# Patient Record
Sex: Female | Born: 1935
Health system: Southern US, Community
[De-identification: ages and names within clinical notes are randomized; demographics above are authoritative.]

## PROBLEM LIST (undated history)

## (undated) DIAGNOSIS — E7800 Pure hypercholesterolemia, unspecified: Secondary | ICD-10-CM

## (undated) DIAGNOSIS — H9190 Unspecified hearing loss, unspecified ear: Secondary | ICD-10-CM

## (undated) DIAGNOSIS — E041 Nontoxic single thyroid nodule: Secondary | ICD-10-CM

## (undated) DIAGNOSIS — M1990 Unspecified osteoarthritis, unspecified site: Secondary | ICD-10-CM

## (undated) DIAGNOSIS — K219 Gastro-esophageal reflux disease without esophagitis: Secondary | ICD-10-CM

## (undated) DIAGNOSIS — F32A Depression, unspecified: Secondary | ICD-10-CM

## (undated) DIAGNOSIS — R413 Other amnesia: Secondary | ICD-10-CM

## (undated) DIAGNOSIS — F329 Major depressive disorder, single episode, unspecified: Secondary | ICD-10-CM

## (undated) DIAGNOSIS — M25569 Pain in unspecified knee: Secondary | ICD-10-CM

## (undated) HISTORY — DX: Depression, unspecified: F32.A

## (undated) HISTORY — DX: Unspecified hearing loss, unspecified ear: H91.90

## (undated) HISTORY — PX: KNEE SURGERY: SHX244

## (undated) HISTORY — DX: Pure hypercholesterolemia, unspecified: E78.00

## (undated) HISTORY — DX: Other amnesia: R41.3

## (undated) HISTORY — DX: Gastro-esophageal reflux disease without esophagitis: K21.9

## (undated) HISTORY — DX: Pain in unspecified knee: M25.569

## (undated) HISTORY — PX: THYROID SURGERY: SHX805

## (undated) HISTORY — DX: Nontoxic single thyroid nodule: E04.1

## (undated) HISTORY — DX: Unspecified osteoarthritis, unspecified site: M19.90

---
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
## 1998-08-19 ENCOUNTER — Ambulatory Visit (HOSPITAL_BASED_OUTPATIENT_CLINIC_OR_DEPARTMENT_OTHER): Admission: RE | Admit: 1998-08-19 | Discharge: 1998-08-19 | Payer: Self-pay | Admitting: Orthopedic Surgery

## 2006-04-12 ENCOUNTER — Ambulatory Visit (HOSPITAL_COMMUNITY): Admission: RE | Admit: 2006-04-12 | Discharge: 2006-04-12 | Payer: Self-pay | Admitting: Gastroenterology

## 2006-04-12 ENCOUNTER — Ambulatory Visit: Payer: Self-pay | Admitting: Gastroenterology

## 2010-05-27 ENCOUNTER — Ambulatory Visit: Payer: Self-pay | Admitting: Internal Medicine

## 2010-06-16 ENCOUNTER — Ambulatory Visit (HOSPITAL_COMMUNITY)
Admission: RE | Admit: 2010-06-16 | Discharge: 2010-06-16 | Payer: Self-pay | Source: Home / Self Care | Attending: Internal Medicine | Admitting: Internal Medicine

## 2010-06-16 ENCOUNTER — Ambulatory Visit: Admit: 2010-06-16 | Payer: Self-pay | Admitting: Internal Medicine

## 2010-06-18 LAB — H. PYLORI ANTIBODY, IGG: H Pylori IgG: <0.4 {ISR}

## 2010-07-04 NOTE — Op Note (Signed)
Danielle Barnes, Danielle Barnes               ACCOUNT NO.:  [id number]  MEDICAL RECORD NO.:  [id number]          PATIENT TYPE:  AMB  LOCATION:  DAY                           FACILITY:  [facility]  PHYSICIAN:  Lionel December, M.D.    DATE OF BIRTH:  [birth year]  DATE OF PROCEDURE:  06/16/2010 DATE OF DISCHARGE:                              OPERATIVE REPORT   PROCEDURE:  Esophagogastroduodenoscopy with esophageal dilation followed by a colonoscopy.  INDICATION:  Danielle Barnes is a 77 year old Caucasian female with chronic GERD maintained on a PPI who complains of solid food dysphagia.  She also has iron-deficiency anemia.  Her stool, however, is guaiac negative.  She takes Naprosyn up to 1100 mg per day in addition to low-dose ASA. Family history is positive for colon carcinoma in 2 sisters, one was diagnosed at age 40 and died at 59, another one was diagnosed at 26 and diet 2 years later.  Her last colonoscopy was over 5 years ago.  Procedure risks were reviewed with the patient and informed consent was obtained.  MEDICATIONS FOR CONSCIOUS SEDATION: 1. Cetacaine spray for oropharyngeal topical anesthesia. 2. Demerol 50 mg IV. 3. Versed 7 mg IV in divided dose.  FINDINGS:  Procedure performed in endoscopy suite.  The patient's vital signs and O2 sats were monitored during the procedure and remained stable.  PROCEDURES: 1. Esophagogastroduodenoscopy.  The patient was placed in left lateral     recumbent position and Pentax videoscope was passed through     oropharynx without any difficulty into esophagus.   Esophagus:     Mucosa of the esophagus normal.  GE junction was located     at 38-cm from the incisors and hiatus was at 36.  No ring or     stricture was noted.   Stomach:     It was empty and distended very well with insufflation.     Folds of the proximal stomach are normal.  Mucosa at body was     normal in the antrum.  There were 2 small telangiectasia without     stigmata of bleeding.   Towards the anterior wall and antrum was a     semilunar shaped area with erythema and erosion in the scar.  This     was felt to be healing ulcer.  It was about 4-5 mm in maximal     diameter and at least 12-mm long.  Pyloric channel was patent.     Angularis, fundus and cardia were examined by retroflexing scope     were normal.   Duodenum:     Bulbar mucosa was normal.  Scope was passed into     second part of the duodenal mucosa and folds were normal.     Endoscope was withdrawn.   Esophageal dilation was performed by passing 54-French Maloney     dilator to full insertion.  As the dilators were withdrawn,     endoscope was passed again and esophageal mucosa reexamined and no     disruption noted.  Endoscope was withdrawn and the patient prepared     for procedure #2. 2. Colonoscopy:  Rectal examination performed.  No abnormality noted     on external or digital exam.  Pentax videoscope was placed through     rectum and advanced under vision into sigmoid colon beyond.  She     had diffuse pigmentation consistent with melanosis coli.  She had     very redundant colon.  Scattered diverticula noted at sigmoid and     one at hepatic flexure.  Scope was passed into cecum which was     identified by appendiceal orifice and ileocecal valve.  Pictures     were taken for the record.  To the right of appendiceal orifice was     a area with pale slightly raised mucosa.  This was ablated via cold     biopsy.  As the scope was withdrawn, rest of the colonic mucosa was     carefully examined and no other abnormalities were noted.  Rectal     mucosa was normal.  Scope was retroflexed to examine anorectal     junction which was unremarkable.  Endoscope was straightened and     withdrawn  Withdrawal time was over 7 minutes.  The patient     tolerated the procedure well.  FINAL DIAGNOSES: 1. Small sliding hiatal hernia without ring or stricture formation. 2. Esophagus dilated by passing  54-French Maloney dilator given     history of dysphagia. 3. Two small telangiectasia at gastric antrum without stigmata of     bleeding. 4. Healing gastric ulcer at antrum, possibly secondary to NSAID     therapy. 5. Colonoscopy completed to cecum. 6. Melanosis coli with few diverticula at the sigmoid colon, one     hepatic flexure. 7. Pale slightly raised mucosa at the cecum which was ablated via cold     biopsy.  This could be hyperplastic polyp.  RECOMMENDATIONS: 1. She will continue high-fiber diet and Prilosec as before. 2. The patient advised to discontinue Naprosyn and take ibuprofen OTC     2-400 mg with food twice daily and no more. 3. We will check her H. pylori serology today. 4. I will contact the patient with results of biopsy and further     recommendations.     Lionel December, M.D.     NR/MEDQ  D:  06/16/2010  T:  06/16/2010  Job:  [id number]  cc:   Dr. Sherryll Burger  Electronically Signed by Lionel December M.D. on 07/04/2010 12:52:52 PM

## 2010-10-17 NOTE — Op Note (Signed)
NAMEMEHAK, ROSKELLEY               ACCOUNT NO.:  [id number]   MEDICAL RECORD NO.:  [id number]          PATIENT TYPE:  AMB   LOCATION:  DAY                           FACILITY:  [facility]   PHYSICIAN:  Kassie Mends, M.D.      DATE OF BIRTH:  1932/03/17   DATE OF PROCEDURE:  04/12/2006  DATE OF DISCHARGE:                                 OPERATIVE REPORT   REFERRING PHYSICIAN:  Dr. Weyman Pedro.   PROCEDURE:  Colonoscopy.   INDICATION FOR EXAMINATION:  Ms. Kroenke is a 77 year old female whose sister  had colon cancer at 16.   FINDINGS:  1. Normal colon without evidence of polyps, masses, diverticula,      inflammatory changes or vascular ectasias.  2. Normal retroflexed view of the rectum.   RECOMMENDATIONS:  1. Screening colonoscopy in 5 years.  2. Follow up with Dr. Eliberto Ivory.   MEDICATIONS:  1. Demerol 50 mg IV.  2. Versed 5 mg IV.   PROCEDURE TECHNIQUE:  Physical exam was performed, and informed consent was  obtained from the patient after explaining the benefits, risks and  alternatives to the procedure.  The patient was connected to monitor and  placed in the left lateral position.  Continuous oxygen was provided by  nasal, and IV medicine administered through an indwelling cannula.  After  administration of sedation and rectal exam, the patient's rectum was  intubated, and the  scope was advanced under direct visualization to the cecum.  The scope was  subsequently removed slowly by carefully examining the color, texture,  anatomy and integrity of the mucosa on the way out.  The patient was  recovered in the endoscopy suite and discharged home in satisfactory  condition.      Kassie Mends, M.D.  Electronically Signed     SM/MEDQ  D:  04/12/2006  T:  04/12/2006  Job:  [id number]   cc:   Weyman Pedro  [address]  [place]  [place] [address]

## 2010-12-10 ENCOUNTER — Other Ambulatory Visit (HOSPITAL_COMMUNITY): Payer: Self-pay | Admitting: Orthopedic Surgery

## 2010-12-10 ENCOUNTER — Encounter (HOSPITAL_COMMUNITY)
Admission: RE | Admit: 2010-12-10 | Discharge: 2010-12-10 | Disposition: A | Discharge status: Home / Self Care | Payer: Medicare Other | Source: Ambulatory Visit | Attending: Orthopedic Surgery | Admitting: Orthopedic Surgery

## 2010-12-10 ENCOUNTER — Ambulatory Visit (HOSPITAL_COMMUNITY)
Admission: RE | Admit: 2010-12-10 | Discharge: 2010-12-10 | Disposition: A | Discharge status: Home / Self Care | Payer: Medicare Other | Source: Ambulatory Visit | Attending: Orthopedic Surgery | Admitting: Orthopedic Surgery

## 2010-12-10 DIAGNOSIS — M4120 Other idiopathic scoliosis, site unspecified: Secondary | ICD-10-CM | POA: Insufficient documentation

## 2010-12-10 DIAGNOSIS — Z01812 Encounter for preprocedural laboratory examination: Secondary | ICD-10-CM | POA: Insufficient documentation

## 2010-12-10 DIAGNOSIS — M899 Disorder of bone, unspecified: Secondary | ICD-10-CM | POA: Insufficient documentation

## 2010-12-10 DIAGNOSIS — M479 Spondylosis, unspecified: Secondary | ICD-10-CM | POA: Insufficient documentation

## 2010-12-10 DIAGNOSIS — Z01811 Encounter for preprocedural respiratory examination: Secondary | ICD-10-CM

## 2010-12-10 DIAGNOSIS — M949 Disorder of cartilage, unspecified: Secondary | ICD-10-CM | POA: Insufficient documentation

## 2010-12-10 DIAGNOSIS — Z01818 Encounter for other preprocedural examination: Secondary | ICD-10-CM | POA: Insufficient documentation

## 2010-12-10 DIAGNOSIS — IMO0002 Reserved for concepts with insufficient information to code with codable children: Secondary | ICD-10-CM | POA: Insufficient documentation

## 2010-12-10 LAB — URINALYSIS, ROUTINE W REFLEX MICROSCOPIC
Bilirubin Urine: NEGATIVE
Glucose, UA: NEGATIVE mg/dL
Hgb urine dipstick: NEGATIVE
Ketones, ur: NEGATIVE mg/dL
Leukocytes, UA: NEGATIVE
Nitrite: NEGATIVE
Protein, ur: NEGATIVE mg/dL
Specific Gravity, Urine: 1.024 (ref 1.005–1.030)
Urobilinogen, UA: 0.2 mg/dL (ref 0.0–1.0)
pH: 5.5 (ref 5.0–8.0)

## 2010-12-10 LAB — CBC
HCT: 44.8 % (ref 36.0–46.0)
Hemoglobin: 15.1 g/dL — ABNORMAL HIGH (ref 12.0–15.0)
MCH: 31.5 pg (ref 26.0–34.0)
MCHC: 33.7 g/dL (ref 30.0–36.0)
MCV: 93.5 fL (ref 78.0–100.0)
Platelets: 233 10*3/uL (ref 150–400)
RBC: 4.79 MIL/uL (ref 3.87–5.11)
RDW: 13.7 % (ref 11.5–15.5)
WBC: 6.3 10*3/uL (ref 4.0–10.5)

## 2010-12-10 LAB — DIFFERENTIAL
Basophils Absolute: 0 10*3/uL (ref 0.0–0.1)
Basophils Relative: 0 % (ref 0–1)
Eosinophils Absolute: 0.2 10*3/uL (ref 0.0–0.7)
Eosinophils Relative: 3 % (ref 0–5)
Lymphocytes Relative: 26 % (ref 12–46)
Lymphs Abs: 1.6 10*3/uL (ref 0.7–4.0)
Monocytes Absolute: 0.7 10*3/uL (ref 0.1–1.0)
Monocytes Relative: 12 % (ref 3–12)
Neutro Abs: 3.8 10*3/uL (ref 1.7–7.7)
Neutrophils Relative %: 60 % (ref 43–77)

## 2010-12-10 LAB — COMPREHENSIVE METABOLIC PANEL
ALT: 16 U/L (ref 0–35)
AST: 21 U/L (ref 0–37)
Albumin: 3.7 g/dL (ref 3.5–5.2)
Alkaline Phosphatase: 91 U/L (ref 39–117)
BUN: 18 mg/dL (ref 6–23)
CO2: 32 mEq/L (ref 19–32)
Calcium: 9.8 mg/dL (ref 8.4–10.5)
Chloride: 99 mEq/L (ref 96–112)
Creatinine, Ser: 1.12 mg/dL — ABNORMAL HIGH (ref 0.50–1.10)
GFR calc Af Amer: 57 mL/min — ABNORMAL LOW (ref >60)
GFR calc non Af Amer: 47 mL/min — ABNORMAL LOW (ref >60)
Glucose, Bld: 109 mg/dL — ABNORMAL HIGH (ref 70–99)
Potassium: 4.1 mEq/L (ref 3.5–5.1)
Sodium: 137 mEq/L (ref 135–145)
Total Bilirubin: 0.3 mg/dL (ref 0.3–1.2)
Total Protein: 6.8 g/dL (ref 6.0–8.3)

## 2010-12-10 LAB — APTT: aPTT: 28 seconds (ref 24–37)

## 2010-12-10 LAB — TYPE AND SCREEN
ABO/RH(D): O POS
Antibody Screen: NEGATIVE

## 2010-12-10 LAB — SURGICAL PCR SCREEN
MRSA, PCR: NEGATIVE
Staphylococcus aureus: POSITIVE — AB

## 2010-12-10 LAB — ABO/RH: ABO/RH(D): O POS

## 2010-12-10 LAB — PROTIME-INR
INR: 0.93 (ref 0.00–1.49)
Prothrombin Time: 12.7 seconds (ref 11.6–15.2)

## 2010-12-11 ENCOUNTER — Encounter: Payer: Self-pay | Admitting: Cardiology

## 2010-12-11 LAB — URINE CULTURE
Colony Count: 50000
Culture  Setup Time: 201207111231

## 2010-12-12 ENCOUNTER — Encounter: Payer: Self-pay | Admitting: Cardiology

## 2010-12-12 ENCOUNTER — Ambulatory Visit (INDEPENDENT_AMBULATORY_CARE_PROVIDER_SITE_OTHER): Payer: Medicare Other | Admitting: Cardiology

## 2010-12-12 DIAGNOSIS — Z01818 Encounter for other preprocedural examination: Secondary | ICD-10-CM

## 2010-12-12 DIAGNOSIS — I491 Atrial premature depolarization: Secondary | ICD-10-CM

## 2010-12-12 NOTE — Progress Notes (Signed)
   Danielle Barnes Date of Birth: Apr 29, 1932   History of Present Illness: Danielle Barnes is seen at the request of Dr. Sherlean Foot for preoperative evaluation for knee surgery. She is a pleasant 77 year old white female who has been in excellent health. She was scheduled for knee surgery but her ECG was abnormal and she was referred for further evaluation. She states she rarely notices mild palpitations. Had pain tachycardia. She denies any chest pain of breath, dizziness, or syncope. She has no history of myocardial infarction, angina, or congestive heart failure.  Current Outpatient Prescriptions on File Prior to Visit  Medication Sig Dispense Refill  . aspirin 81 MG tablet Take 81 mg by mouth daily.        Marland Kitchen CALCIUM PO Take by mouth.        . fish oil-omega-3 fatty acids 1000 MG capsule Take 1 g by mouth daily.        Marland Kitchen HYDROcodone-acetaminophen (LORTAB) 7.5-500 MG per tablet Take 1 tablet by mouth every 6 (six) hours as needed.        Marland Kitchen levothyroxine (SYNTHROID, LEVOTHROID) 125 MCG tablet Take 125 mcg by mouth daily.        . Multiple Vitamin (MULTIVITAMIN) tablet Take 1 tablet by mouth daily.        Marland Kitchen omeprazole (PRILOSEC) 20 MG capsule Take 20 mg by mouth daily.        . Red Yeast Rice Extract (RED YEAST RICE PO) Take by mouth daily.        . traZODone (DESYREL) 100 MG tablet Take 100 mg by mouth at bedtime.          No Known Allergies  Past Medical History  Diagnosis Date  . Knee pain   . Arthritis   . Hypercholesterolemia   . GERD (gastroesophageal reflux disease)     Past Surgical History  Procedure Date  . Thyroid surgery     lump    History  Smoking status  . Never Smoker   Smokeless tobacco  . Never Used    History  Alcohol Use No    Family History  Problem Relation Age of Onset  . Stroke Mother   . Diabetes Sister   . Cancer Sister   . Heart disease Brother   . Heart attack Brother     Review of Systems: The review of systems is positive for chronic knee  pain..  All other systems were reviewed and are negative.  Physical Exam: BP 136/78  Pulse 78  Ht 5' 3.5" (1.613 m)  Wt 139 lb (63.05 kg)  BMI 24.24 kg/m2 She is a pleasant white female in no acute distress. She is normocephalic, atraumatic. Pupils are round and reactive to light accommodation. Extraocular movements are full. Oropharynx is clear. Neck is supple no JVD, adenopathy, thyromegaly, or bruits. Lungs are clear. Cardiac exam reveals a regular rate and rhythm with occasional extrasystoles. She has normal S1-S2. No gallops, murmur, or click. Abdomen is soft and nontender without masses or bruits. Femoral and pedal pulses are 2+ and symmetric. She has no edema. Skin is warm and dry. She is alert and oriented x3. Cranial nerves II through XII are intact. LABORATORY DATA: ECG was reviewed. This demonstrates normal sinus rhythm with frequent PACs. There is poor R-wave progression V1 through V3. This is unchanged from prior ECG in May of this year. Recent chemistry panel and CBC were normal.  Assessment / Plan:

## 2010-12-12 NOTE — Assessment & Plan Note (Signed)
Patient has frequent PACs on exam. Her cardiac exam is otherwise normal. She is asymptomatic. PACs are benign and do not warrant further evaluation. She is cleared to proceed with her knee surgery.

## 2010-12-12 NOTE — Patient Instructions (Signed)
We will clear you for your knee surgery.  We will send a copy of our assessment to Dr. Sherryll Burger.  Good luck!

## 2010-12-15 ENCOUNTER — Inpatient Hospital Stay (HOSPITAL_COMMUNITY)
Admission: RE | Admit: 2010-12-15 | Discharge: 2010-12-17 | DRG: 470 | Disposition: A | Discharge status: Home Health | Payer: Medicare Other | Source: Ambulatory Visit | Attending: Orthopedic Surgery | Admitting: Orthopedic Surgery

## 2010-12-15 DIAGNOSIS — E039 Hypothyroidism, unspecified: Secondary | ICD-10-CM | POA: Diagnosis present

## 2010-12-15 DIAGNOSIS — M1710 Unilateral primary osteoarthritis, unspecified knee: Principal | ICD-10-CM | POA: Diagnosis present

## 2010-12-15 DIAGNOSIS — K219 Gastro-esophageal reflux disease without esophagitis: Secondary | ICD-10-CM | POA: Diagnosis present

## 2010-12-15 DIAGNOSIS — D62 Acute posthemorrhagic anemia: Secondary | ICD-10-CM | POA: Diagnosis not present

## 2010-12-15 DIAGNOSIS — IMO0002 Reserved for concepts with insufficient information to code with codable children: Principal | ICD-10-CM | POA: Diagnosis present

## 2010-12-16 LAB — CBC
HCT: 33.8 % — ABNORMAL LOW (ref 36.0–46.0)
Hemoglobin: 11.4 g/dL — ABNORMAL LOW (ref 12.0–15.0)
MCH: 31.6 pg (ref 26.0–34.0)
MCHC: 33.7 g/dL (ref 30.0–36.0)
MCV: 93.6 fL (ref 78.0–100.0)
Platelets: 191 10*3/uL (ref 150–400)
RBC: 3.61 MIL/uL — ABNORMAL LOW (ref 3.87–5.11)
RDW: 13.7 % (ref 11.5–15.5)
WBC: 8.6 10*3/uL (ref 4.0–10.5)

## 2010-12-16 LAB — BASIC METABOLIC PANEL
BUN: 11 mg/dL (ref 6–23)
CO2: 31 mEq/L (ref 19–32)
Calcium: 8.9 mg/dL (ref 8.4–10.5)
Chloride: 106 mEq/L (ref 96–112)
Creatinine, Ser: 0.87 mg/dL (ref 0.50–1.10)
GFR calc Af Amer: >60 mL/min (ref >60)
GFR calc non Af Amer: >60 mL/min (ref >60)
Glucose, Bld: 105 mg/dL — ABNORMAL HIGH (ref 70–99)
Potassium: 4.6 mEq/L (ref 3.5–5.1)
Sodium: 142 mEq/L (ref 135–145)

## 2010-12-16 LAB — GLUCOSE, CAPILLARY: Glucose-Capillary: 98 mg/dL (ref 70–99)

## 2010-12-17 LAB — CBC
HCT: 29.5 % — ABNORMAL LOW (ref 36.0–46.0)
Hemoglobin: 9.9 g/dL — ABNORMAL LOW (ref 12.0–15.0)
MCH: 31.6 pg (ref 26.0–34.0)
MCHC: 33.6 g/dL (ref 30.0–36.0)
MCV: 94.2 fL (ref 78.0–100.0)
Platelets: 160 10*3/uL (ref 150–400)
RBC: 3.13 MIL/uL — ABNORMAL LOW (ref 3.87–5.11)
RDW: 14.1 % (ref 11.5–15.5)
WBC: 5.6 10*3/uL (ref 4.0–10.5)

## 2010-12-17 LAB — BASIC METABOLIC PANEL
BUN: 9 mg/dL (ref 6–23)
CO2: 30 mEq/L (ref 19–32)
Calcium: 8.5 mg/dL (ref 8.4–10.5)
Chloride: 105 mEq/L (ref 96–112)
Creatinine, Ser: 0.62 mg/dL (ref 0.50–1.10)
GFR calc Af Amer: >60 mL/min (ref >60)
GFR calc non Af Amer: >60 mL/min (ref >60)
Glucose, Bld: 113 mg/dL — ABNORMAL HIGH (ref 70–99)
Potassium: 3.7 mEq/L (ref 3.5–5.1)
Sodium: 140 mEq/L (ref 135–145)

## 2010-12-31 NOTE — Op Note (Signed)
NAMEBROOKELYNNE, DIMPERIO NO.:  [id number]  MEDICAL RECORD NO.:  [id number]  LOCATION:  5035                         FACILITY:  [facility]  PHYSICIAN:  Mila Homer. Sherlean Foot, M.D. DATE OF BIRTH:  1931-11-10  DATE OF PROCEDURE:  12/15/2010 DATE OF DISCHARGE:                              OPERATIVE REPORT   SURGEON:  Mila Homer. Sherlean Foot, MD  ASSISTANTS: 1. Altamese Cabal, PA-C 2. __________, PA-C  ANESTHESIA:  General.  PREOPERATIVE DIAGNOSIS:  Right knee osteoarthritis.  POSTOPERATIVE DIAGNOSIS:  Right knee osteoarthritis.  PROCEDURE:  Right total knee arthroplasty.  INDICATIONS FOR PROCEDURE:  The patient is a 77 year old white female with failure of conservative measures for osteoarthritis of the right knee.  Informed consent was obtained.  DESCRIPTION OF PROCEDURE:  The patient was laid supine, administered general anesthesia, right leg was prepped and draped in usual sterile fashion.  The extremity was exsanguinated with the Esmarch and tourniquet inflated to 350 mmHg.  A midline incision was made with a #10 blade.  New blade was used to make a median parapatellar arthrotomy and performed a synovectomy.  Deep MCL was elevated off the medial crest of the tibia around to and through the semimembranosus tendon.  Then, I everted the patella, measured 23 mm thick and had incredible number of osteophytes.  Large osteophytes were removed and the patella was reamed down 8.5 mm and through lug holes, were drilled through the 32-mm template.  I had recreated a native thickness with 32 template in place. I then removed the trial 1 and 2 flexion, subluxing the patella laterally.  I then used the extramedullary alignment system on the tibia to make perpendicular cut to the anatomic axis of the tibia in  30 degrees posterior slope.  I then used the intramedullary system on the field to make a 60-degree valgus cut.  The epicondylar axis measured 30 degrees.  I sized to size 8 and  pinned to 30-degree external rotation holes.  I then had an anterior-posterior chamfer cuts with sagittal saw. At this point, a 10-mm spacer block in the knee.  I had good flexion and extension gap balance.  I then finished the femur with size 8 finishing block, finished the tibia with a size 4 tibial tray, drilling keel.  I then trialed with an E femur, 4 tibia, 10 insert, 32 patella.  I had good flexion and extension gap balance __________ degrees.  I then removed the trial components and copiously irrigated.  I then cemented the components removing all excess cement allowing the cement to harden in extension.  I left a Hemovac coming out superolaterally and deep to the arthrotomy pain catheter coming out supermedial and superficial to the arthrotomy.  I then let the tourniquet down, obtaining hemostasis. I then lavaged again and closed the arthrotomy with figure-of-eight #1 Vicryl sutures, buried 0 Vicryl sutures, subcuticular 2-0 Vicryl stitches, skin staples.  Dressed with Xeroform dressing sponges, sterile Webril, __________ stocking.  COMPLICATIONS:  None.  DRAINS:  One Hemovac and one pain  catheter.  ESTIMATED BLOOD LOSS:  300 mL.          ______________________________ Mila Homer. Sherlean Foot, M.D.     SDL/MEDQ  D:  12/15/2010  T:  12/15/2010  Job:  [id number]  Electronically Signed by Georgena Spurling M.D. on 12/31/2010 08:09:40 AM

## 2011-08-14 IMAGING — CR DG CHEST 2V
2 series · 2 of 2 positions shown · non-contrast
Comparison: None.

CLINICAL DATA: Preoperative cardiopulmonary evaluation.

CHEST - 2 VIEW

[view not recorded (1 of 2)]
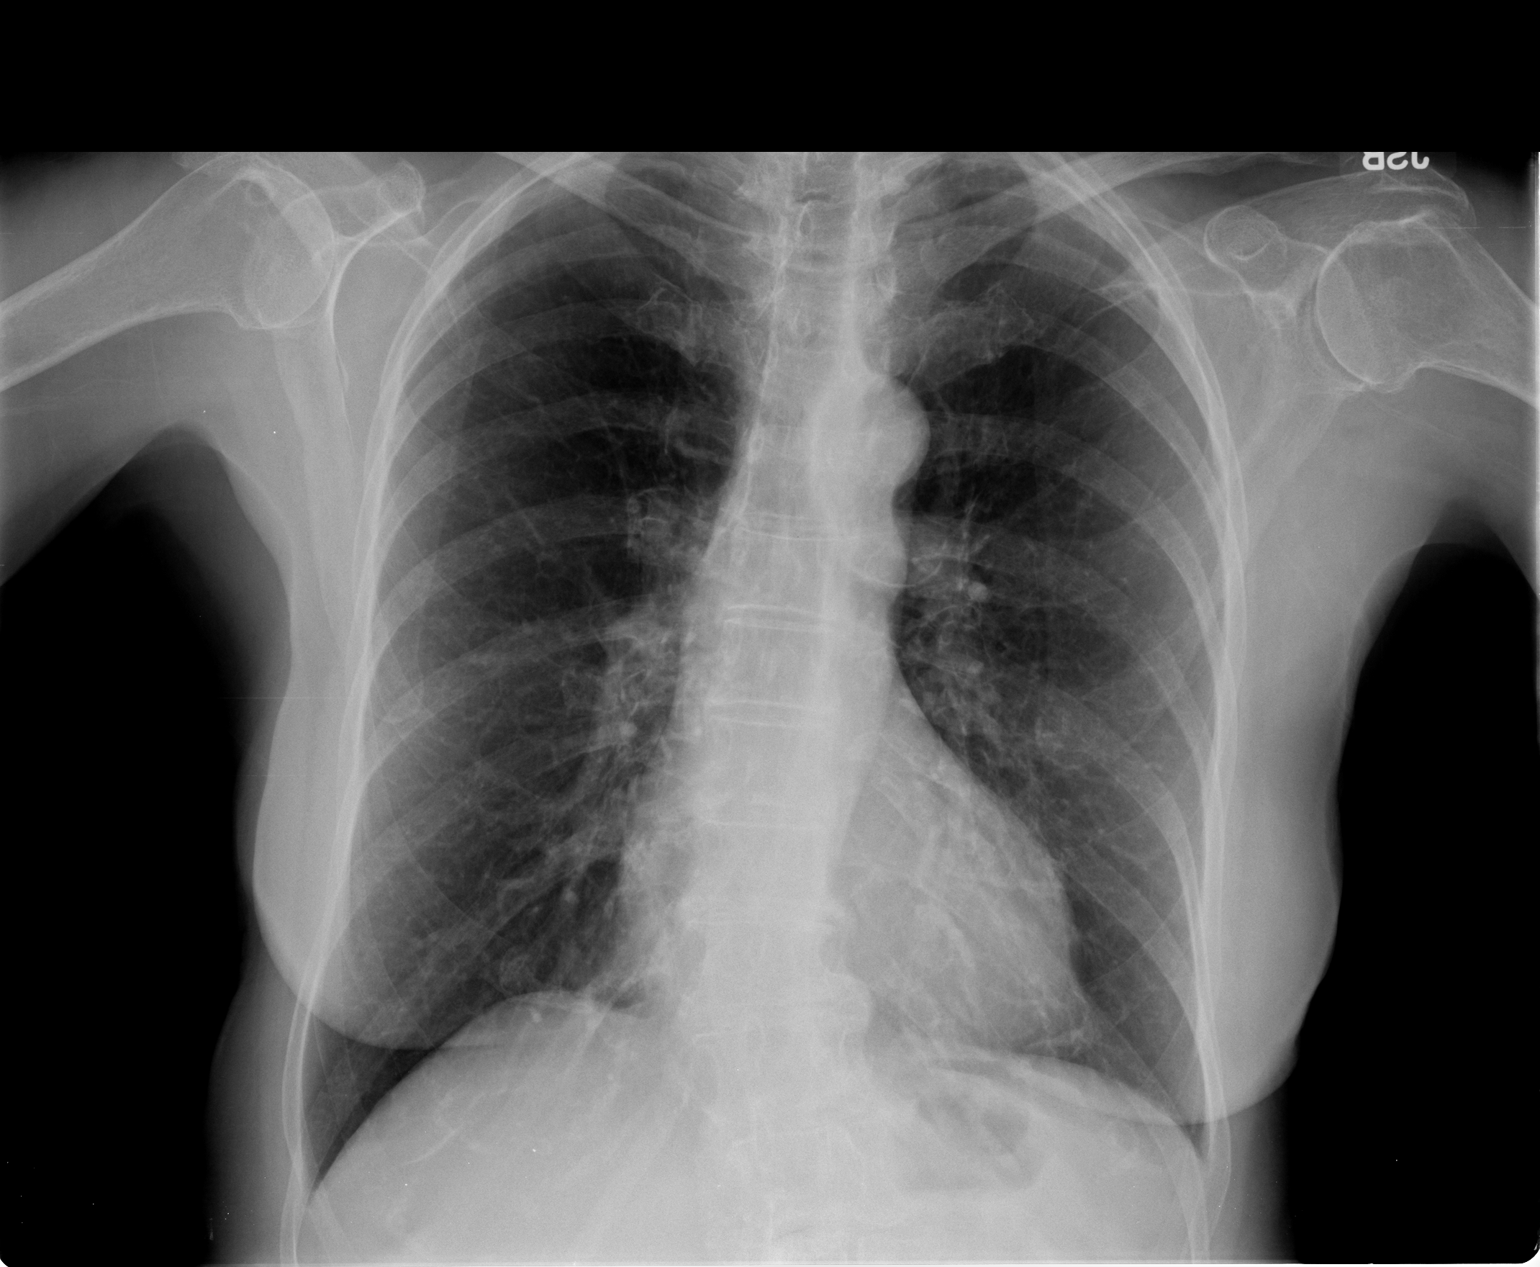

[view not recorded (2 of 2)]
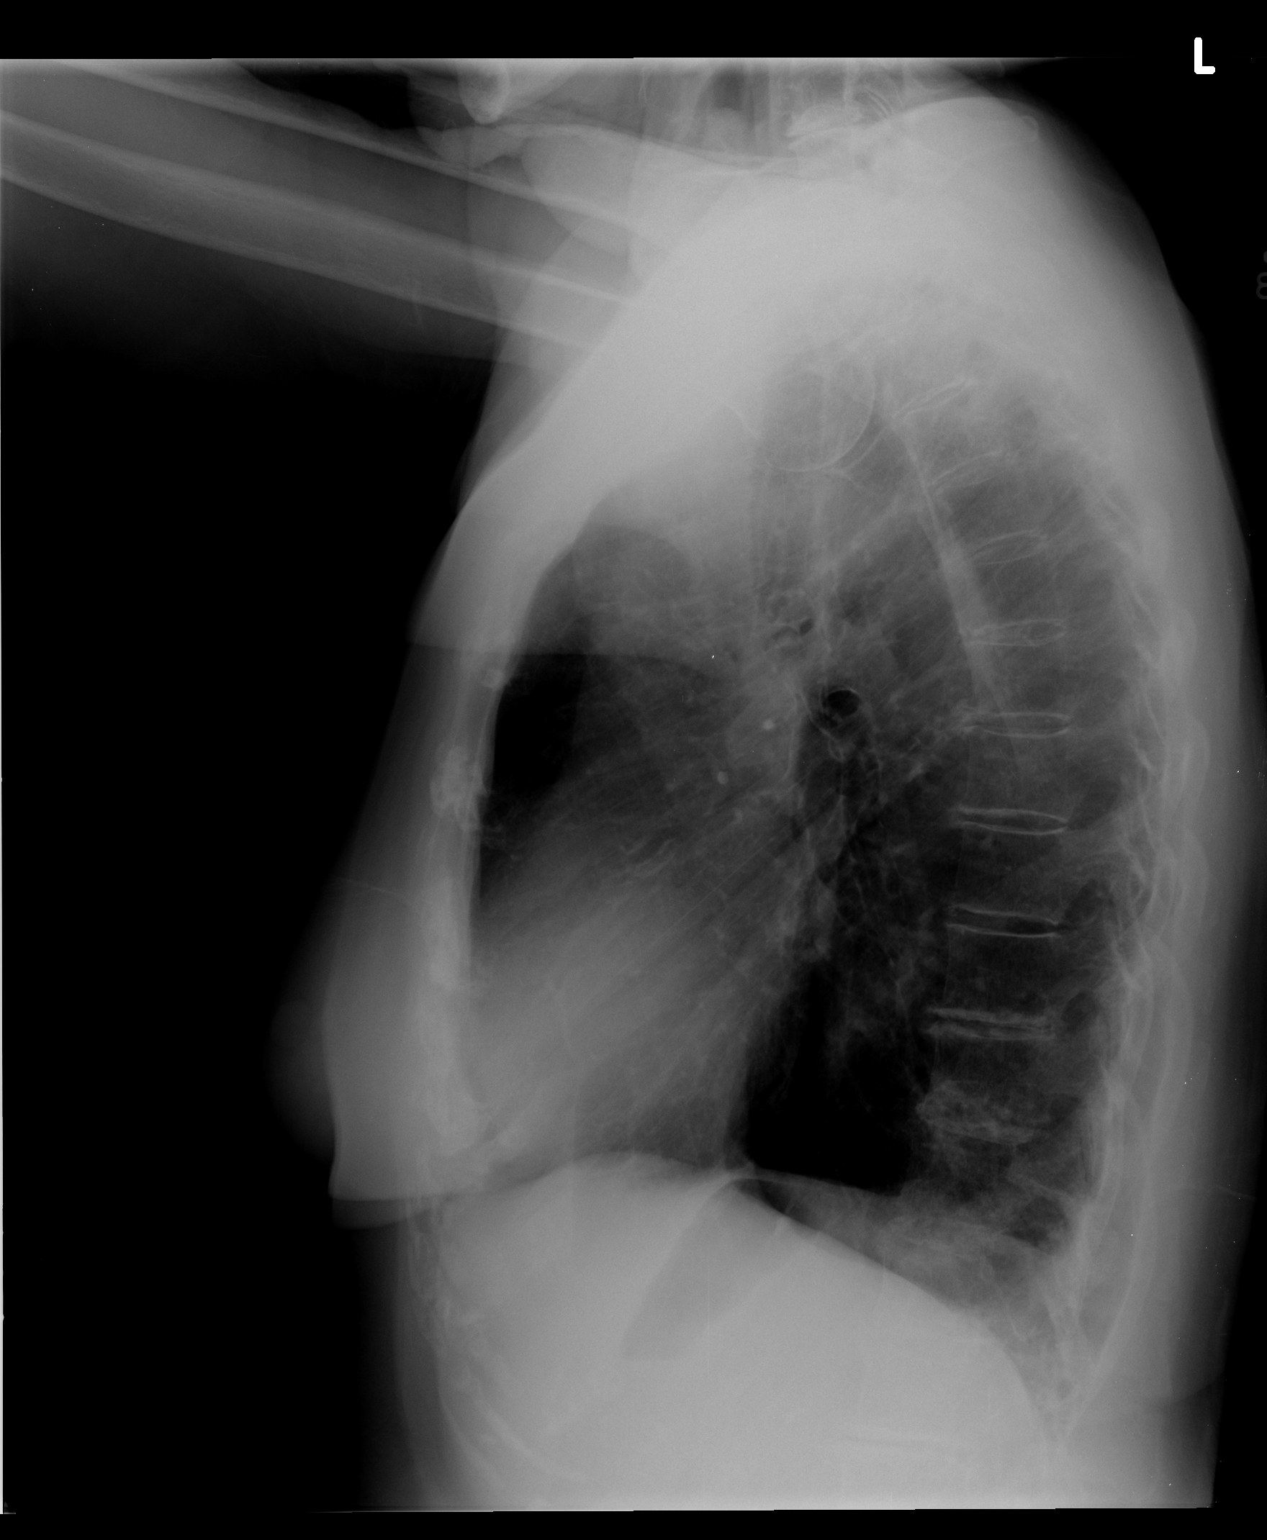

[2 of 2 positions shown; findings below may reference images not displayed]

FINDINGS: Cardiac silhouette is upper range of normal size.  There
is slight aortic ectasia.  There is flattening of the diaphragm on
lateral image with slight hyperinflation configuration.  Lungs are
free of infiltrates.
No pleural effusion is seen.  There is osteopenic appearance of the
bones.  Changes of degenerative disc disease and degenerative
spondylosis are present.  There is moderate scoliosis convexity to
the right.
IMPRESSION: Slight hyperinflation configuration with no acute superimposed
process.  Chronic bony changes are described above.

## 2014-07-17 DIAGNOSIS — M7732 Calcaneal spur, left foot: Secondary | ICD-10-CM | POA: Diagnosis not present

## 2014-07-17 DIAGNOSIS — N390 Urinary tract infection, site not specified: Secondary | ICD-10-CM | POA: Diagnosis not present

## 2014-07-17 DIAGNOSIS — Z789 Other specified health status: Secondary | ICD-10-CM | POA: Diagnosis not present

## 2014-07-17 DIAGNOSIS — Z6825 Body mass index (BMI) 25.0-25.9, adult: Secondary | ICD-10-CM | POA: Diagnosis not present

## 2014-07-17 DIAGNOSIS — R350 Frequency of micturition: Secondary | ICD-10-CM | POA: Diagnosis not present

## 2014-07-17 DIAGNOSIS — Z418 Encounter for other procedures for purposes other than remedying health state: Secondary | ICD-10-CM | POA: Diagnosis not present

## 2014-11-02 DIAGNOSIS — Z789 Other specified health status: Secondary | ICD-10-CM | POA: Diagnosis not present

## 2014-11-02 DIAGNOSIS — E039 Hypothyroidism, unspecified: Secondary | ICD-10-CM | POA: Diagnosis not present

## 2014-11-02 DIAGNOSIS — R350 Frequency of micturition: Secondary | ICD-10-CM | POA: Diagnosis not present

## 2014-11-02 DIAGNOSIS — Z6826 Body mass index (BMI) 26.0-26.9, adult: Secondary | ICD-10-CM | POA: Diagnosis not present

## 2014-11-02 DIAGNOSIS — N390 Urinary tract infection, site not specified: Secondary | ICD-10-CM | POA: Diagnosis not present

## 2014-12-04 DIAGNOSIS — Z1231 Encounter for screening mammogram for malignant neoplasm of breast: Secondary | ICD-10-CM | POA: Diagnosis not present

## 2014-12-31 DIAGNOSIS — L821 Other seborrheic keratosis: Secondary | ICD-10-CM | POA: Diagnosis not present

## 2014-12-31 DIAGNOSIS — D225 Melanocytic nevi of trunk: Secondary | ICD-10-CM | POA: Diagnosis not present

## 2015-01-07 DIAGNOSIS — Z418 Encounter for other procedures for purposes other than remedying health state: Secondary | ICD-10-CM | POA: Diagnosis not present

## 2015-01-07 DIAGNOSIS — Z0000 Encounter for general adult medical examination without abnormal findings: Secondary | ICD-10-CM | POA: Diagnosis not present

## 2015-01-07 DIAGNOSIS — Z6825 Body mass index (BMI) 25.0-25.9, adult: Secondary | ICD-10-CM | POA: Diagnosis not present

## 2015-01-07 DIAGNOSIS — Z1211 Encounter for screening for malignant neoplasm of colon: Secondary | ICD-10-CM | POA: Diagnosis not present

## 2015-01-07 DIAGNOSIS — Z1389 Encounter for screening for other disorder: Secondary | ICD-10-CM | POA: Diagnosis not present

## 2015-01-08 DIAGNOSIS — R5383 Other fatigue: Secondary | ICD-10-CM | POA: Diagnosis not present

## 2015-01-08 DIAGNOSIS — E780 Pure hypercholesterolemia: Secondary | ICD-10-CM | POA: Diagnosis not present

## 2015-01-14 DIAGNOSIS — I4891 Unspecified atrial fibrillation: Secondary | ICD-10-CM | POA: Diagnosis not present

## 2015-01-14 DIAGNOSIS — Z418 Encounter for other procedures for purposes other than remedying health state: Secondary | ICD-10-CM | POA: Diagnosis not present

## 2015-01-14 DIAGNOSIS — H6120 Impacted cerumen, unspecified ear: Secondary | ICD-10-CM | POA: Diagnosis not present

## 2015-02-19 DIAGNOSIS — E2839 Other primary ovarian failure: Secondary | ICD-10-CM | POA: Diagnosis not present

## 2015-02-20 DIAGNOSIS — H40053 Ocular hypertension, bilateral: Secondary | ICD-10-CM | POA: Diagnosis not present

## 2015-03-02 DIAGNOSIS — Z23 Encounter for immunization: Secondary | ICD-10-CM | POA: Diagnosis not present

## 2015-07-17 DIAGNOSIS — N390 Urinary tract infection, site not specified: Secondary | ICD-10-CM | POA: Diagnosis not present

## 2015-08-06 DIAGNOSIS — H2513 Age-related nuclear cataract, bilateral: Secondary | ICD-10-CM | POA: Diagnosis not present

## 2015-08-26 DIAGNOSIS — H01005 Unspecified blepharitis left lower eyelid: Secondary | ICD-10-CM | POA: Diagnosis not present

## 2015-09-12 DIAGNOSIS — H25011 Cortical age-related cataract, right eye: Secondary | ICD-10-CM | POA: Diagnosis not present

## 2015-09-12 DIAGNOSIS — H2511 Age-related nuclear cataract, right eye: Secondary | ICD-10-CM | POA: Diagnosis not present

## 2015-09-12 DIAGNOSIS — H25012 Cortical age-related cataract, left eye: Secondary | ICD-10-CM | POA: Diagnosis not present

## 2015-09-12 DIAGNOSIS — H35362 Drusen (degenerative) of macula, left eye: Secondary | ICD-10-CM | POA: Diagnosis not present

## 2015-09-12 DIAGNOSIS — H2512 Age-related nuclear cataract, left eye: Secondary | ICD-10-CM | POA: Diagnosis not present

## 2015-09-12 DIAGNOSIS — H35361 Drusen (degenerative) of macula, right eye: Secondary | ICD-10-CM | POA: Diagnosis not present

## 2015-09-26 DIAGNOSIS — H029 Unspecified disorder of eyelid: Secondary | ICD-10-CM | POA: Diagnosis not present

## 2015-10-14 DIAGNOSIS — H01005 Unspecified blepharitis left lower eyelid: Secondary | ICD-10-CM | POA: Diagnosis not present

## 2015-10-14 DIAGNOSIS — H029 Unspecified disorder of eyelid: Secondary | ICD-10-CM | POA: Diagnosis not present

## 2015-10-14 DIAGNOSIS — H01004 Unspecified blepharitis left upper eyelid: Secondary | ICD-10-CM | POA: Diagnosis not present

## 2015-10-14 DIAGNOSIS — H01001 Unspecified blepharitis right upper eyelid: Secondary | ICD-10-CM | POA: Diagnosis not present

## 2015-10-14 DIAGNOSIS — D2312 Other benign neoplasm of skin of left eyelid, including canthus: Secondary | ICD-10-CM | POA: Diagnosis not present

## 2015-10-14 DIAGNOSIS — H2513 Age-related nuclear cataract, bilateral: Secondary | ICD-10-CM | POA: Diagnosis not present

## 2015-10-14 DIAGNOSIS — H01002 Unspecified blepharitis right lower eyelid: Secondary | ICD-10-CM | POA: Diagnosis not present

## 2015-10-22 DIAGNOSIS — H029 Unspecified disorder of eyelid: Secondary | ICD-10-CM | POA: Diagnosis not present

## 2015-10-22 DIAGNOSIS — D2312 Other benign neoplasm of skin of left eyelid, including canthus: Secondary | ICD-10-CM | POA: Diagnosis not present

## 2015-10-22 DIAGNOSIS — L930 Discoid lupus erythematosus: Secondary | ICD-10-CM | POA: Diagnosis not present

## 2015-12-06 DIAGNOSIS — H2511 Age-related nuclear cataract, right eye: Secondary | ICD-10-CM | POA: Diagnosis not present

## 2015-12-06 DIAGNOSIS — H25011 Cortical age-related cataract, right eye: Secondary | ICD-10-CM | POA: Diagnosis not present

## 2015-12-06 DIAGNOSIS — H25012 Cortical age-related cataract, left eye: Secondary | ICD-10-CM | POA: Diagnosis not present

## 2015-12-06 DIAGNOSIS — H2512 Age-related nuclear cataract, left eye: Secondary | ICD-10-CM | POA: Diagnosis not present

## 2015-12-06 DIAGNOSIS — H35362 Drusen (degenerative) of macula, left eye: Secondary | ICD-10-CM | POA: Diagnosis not present

## 2015-12-06 DIAGNOSIS — H35361 Drusen (degenerative) of macula, right eye: Secondary | ICD-10-CM | POA: Diagnosis not present

## 2015-12-10 DIAGNOSIS — Z1231 Encounter for screening mammogram for malignant neoplasm of breast: Secondary | ICD-10-CM | POA: Diagnosis not present

## 2015-12-17 DIAGNOSIS — H2512 Age-related nuclear cataract, left eye: Secondary | ICD-10-CM | POA: Diagnosis not present

## 2015-12-17 DIAGNOSIS — H25812 Combined forms of age-related cataract, left eye: Secondary | ICD-10-CM | POA: Diagnosis not present

## 2015-12-24 DIAGNOSIS — Z96651 Presence of right artificial knee joint: Secondary | ICD-10-CM | POA: Diagnosis not present

## 2015-12-24 DIAGNOSIS — M25561 Pain in right knee: Secondary | ICD-10-CM | POA: Diagnosis not present

## 2016-01-06 DIAGNOSIS — H2511 Age-related nuclear cataract, right eye: Secondary | ICD-10-CM | POA: Diagnosis not present

## 2016-01-06 DIAGNOSIS — H25011 Cortical age-related cataract, right eye: Secondary | ICD-10-CM | POA: Diagnosis not present

## 2016-01-14 DIAGNOSIS — H25811 Combined forms of age-related cataract, right eye: Secondary | ICD-10-CM | POA: Diagnosis not present

## 2016-01-14 DIAGNOSIS — H2511 Age-related nuclear cataract, right eye: Secondary | ICD-10-CM | POA: Diagnosis not present

## 2016-01-14 DIAGNOSIS — H25011 Cortical age-related cataract, right eye: Secondary | ICD-10-CM | POA: Diagnosis not present

## 2016-02-18 DIAGNOSIS — E039 Hypothyroidism, unspecified: Secondary | ICD-10-CM | POA: Diagnosis not present

## 2016-02-18 DIAGNOSIS — G4700 Insomnia, unspecified: Secondary | ICD-10-CM | POA: Diagnosis not present

## 2016-02-18 DIAGNOSIS — E785 Hyperlipidemia, unspecified: Secondary | ICD-10-CM | POA: Diagnosis not present

## 2016-02-18 DIAGNOSIS — K210 Gastro-esophageal reflux disease with esophagitis: Secondary | ICD-10-CM | POA: Diagnosis not present

## 2016-02-18 DIAGNOSIS — Z23 Encounter for immunization: Secondary | ICD-10-CM | POA: Diagnosis not present

## 2016-02-18 DIAGNOSIS — F339 Major depressive disorder, recurrent, unspecified: Secondary | ICD-10-CM | POA: Diagnosis not present

## 2016-03-06 DIAGNOSIS — R7301 Impaired fasting glucose: Secondary | ICD-10-CM | POA: Diagnosis not present

## 2016-03-06 DIAGNOSIS — I482 Chronic atrial fibrillation: Secondary | ICD-10-CM | POA: Diagnosis not present

## 2016-03-06 DIAGNOSIS — E039 Hypothyroidism, unspecified: Secondary | ICD-10-CM | POA: Diagnosis not present

## 2016-03-06 DIAGNOSIS — E119 Type 2 diabetes mellitus without complications: Secondary | ICD-10-CM | POA: Diagnosis not present

## 2016-03-06 DIAGNOSIS — I10 Essential (primary) hypertension: Secondary | ICD-10-CM | POA: Diagnosis not present

## 2016-03-06 DIAGNOSIS — E782 Mixed hyperlipidemia: Secondary | ICD-10-CM | POA: Diagnosis not present

## 2016-03-06 DIAGNOSIS — E785 Hyperlipidemia, unspecified: Secondary | ICD-10-CM | POA: Diagnosis not present

## 2016-03-06 DIAGNOSIS — D509 Iron deficiency anemia, unspecified: Secondary | ICD-10-CM | POA: Diagnosis not present

## 2016-03-10 DIAGNOSIS — G4700 Insomnia, unspecified: Secondary | ICD-10-CM | POA: Diagnosis not present

## 2016-03-10 DIAGNOSIS — F339 Major depressive disorder, recurrent, unspecified: Secondary | ICD-10-CM | POA: Diagnosis not present

## 2016-03-10 DIAGNOSIS — K210 Gastro-esophageal reflux disease with esophagitis: Secondary | ICD-10-CM | POA: Diagnosis not present

## 2016-03-10 DIAGNOSIS — Z0000 Encounter for general adult medical examination without abnormal findings: Secondary | ICD-10-CM | POA: Diagnosis not present

## 2016-03-10 DIAGNOSIS — Z23 Encounter for immunization: Secondary | ICD-10-CM | POA: Diagnosis not present

## 2016-03-10 DIAGNOSIS — E039 Hypothyroidism, unspecified: Secondary | ICD-10-CM | POA: Diagnosis not present

## 2016-03-10 DIAGNOSIS — Z6824 Body mass index (BMI) 24.0-24.9, adult: Secondary | ICD-10-CM | POA: Diagnosis not present

## 2016-04-20 DIAGNOSIS — H01001 Unspecified blepharitis right upper eyelid: Secondary | ICD-10-CM | POA: Diagnosis not present

## 2016-04-20 DIAGNOSIS — Z9842 Cataract extraction status, left eye: Secondary | ICD-10-CM | POA: Diagnosis not present

## 2016-04-20 DIAGNOSIS — H2513 Age-related nuclear cataract, bilateral: Secondary | ICD-10-CM | POA: Diagnosis not present

## 2016-04-20 DIAGNOSIS — H029 Unspecified disorder of eyelid: Secondary | ICD-10-CM | POA: Diagnosis not present

## 2016-04-20 DIAGNOSIS — H01002 Unspecified blepharitis right lower eyelid: Secondary | ICD-10-CM | POA: Diagnosis not present

## 2016-04-20 DIAGNOSIS — H01005 Unspecified blepharitis left lower eyelid: Secondary | ICD-10-CM | POA: Diagnosis not present

## 2016-04-20 DIAGNOSIS — Z961 Presence of intraocular lens: Secondary | ICD-10-CM | POA: Diagnosis not present

## 2016-04-20 DIAGNOSIS — Z9841 Cataract extraction status, right eye: Secondary | ICD-10-CM | POA: Diagnosis not present

## 2016-04-20 DIAGNOSIS — H01004 Unspecified blepharitis left upper eyelid: Secondary | ICD-10-CM | POA: Diagnosis not present

## 2016-07-07 ENCOUNTER — Encounter: Payer: Self-pay | Admitting: Internal Medicine

## 2016-08-26 DIAGNOSIS — N390 Urinary tract infection, site not specified: Secondary | ICD-10-CM | POA: Diagnosis not present

## 2016-09-03 DIAGNOSIS — E785 Hyperlipidemia, unspecified: Secondary | ICD-10-CM | POA: Diagnosis not present

## 2016-09-03 DIAGNOSIS — E039 Hypothyroidism, unspecified: Secondary | ICD-10-CM | POA: Diagnosis not present

## 2016-09-03 DIAGNOSIS — I10 Essential (primary) hypertension: Secondary | ICD-10-CM | POA: Diagnosis not present

## 2016-09-08 DIAGNOSIS — K210 Gastro-esophageal reflux disease with esophagitis: Secondary | ICD-10-CM | POA: Diagnosis not present

## 2016-09-08 DIAGNOSIS — G4700 Insomnia, unspecified: Secondary | ICD-10-CM | POA: Diagnosis not present

## 2016-09-08 DIAGNOSIS — F330 Major depressive disorder, recurrent, mild: Secondary | ICD-10-CM | POA: Diagnosis not present

## 2016-09-08 DIAGNOSIS — E039 Hypothyroidism, unspecified: Secondary | ICD-10-CM | POA: Diagnosis not present

## 2016-09-08 DIAGNOSIS — Z6825 Body mass index (BMI) 25.0-25.9, adult: Secondary | ICD-10-CM | POA: Diagnosis not present

## 2016-10-09 DIAGNOSIS — K219 Gastro-esophageal reflux disease without esophagitis: Secondary | ICD-10-CM | POA: Diagnosis not present

## 2016-10-09 DIAGNOSIS — Z6824 Body mass index (BMI) 24.0-24.9, adult: Secondary | ICD-10-CM | POA: Diagnosis not present

## 2016-10-09 DIAGNOSIS — E039 Hypothyroidism, unspecified: Secondary | ICD-10-CM | POA: Diagnosis not present

## 2016-10-09 DIAGNOSIS — Z23 Encounter for immunization: Secondary | ICD-10-CM | POA: Diagnosis not present

## 2016-10-09 DIAGNOSIS — H6123 Impacted cerumen, bilateral: Secondary | ICD-10-CM | POA: Diagnosis not present

## 2016-10-09 DIAGNOSIS — E782 Mixed hyperlipidemia: Secondary | ICD-10-CM | POA: Diagnosis not present

## 2016-10-19 DIAGNOSIS — H029 Unspecified disorder of eyelid: Secondary | ICD-10-CM | POA: Diagnosis not present

## 2016-10-19 DIAGNOSIS — H0289 Other specified disorders of eyelid: Secondary | ICD-10-CM | POA: Diagnosis not present

## 2016-10-19 DIAGNOSIS — Z9842 Cataract extraction status, left eye: Secondary | ICD-10-CM | POA: Diagnosis not present

## 2016-10-19 DIAGNOSIS — Z961 Presence of intraocular lens: Secondary | ICD-10-CM | POA: Diagnosis not present

## 2016-10-19 DIAGNOSIS — H527 Unspecified disorder of refraction: Secondary | ICD-10-CM | POA: Diagnosis not present

## 2016-10-19 DIAGNOSIS — Z9841 Cataract extraction status, right eye: Secondary | ICD-10-CM | POA: Diagnosis not present

## 2016-10-19 DIAGNOSIS — H02725 Madarosis of left lower eyelid and periocular area: Secondary | ICD-10-CM | POA: Diagnosis not present

## 2016-11-10 DIAGNOSIS — H268 Other specified cataract: Secondary | ICD-10-CM | POA: Diagnosis not present

## 2016-11-10 DIAGNOSIS — Z961 Presence of intraocular lens: Secondary | ICD-10-CM | POA: Diagnosis not present

## 2016-11-10 DIAGNOSIS — H353131 Nonexudative age-related macular degeneration, bilateral, early dry stage: Secondary | ICD-10-CM | POA: Diagnosis not present

## 2016-11-10 DIAGNOSIS — H26492 Other secondary cataract, left eye: Secondary | ICD-10-CM | POA: Diagnosis not present

## 2016-12-21 DIAGNOSIS — Z1231 Encounter for screening mammogram for malignant neoplasm of breast: Secondary | ICD-10-CM | POA: Diagnosis not present

## 2017-02-04 DIAGNOSIS — E782 Mixed hyperlipidemia: Secondary | ICD-10-CM | POA: Diagnosis not present

## 2017-02-04 DIAGNOSIS — H6123 Impacted cerumen, bilateral: Secondary | ICD-10-CM | POA: Diagnosis not present

## 2017-02-04 DIAGNOSIS — E039 Hypothyroidism, unspecified: Secondary | ICD-10-CM | POA: Diagnosis not present

## 2017-02-04 DIAGNOSIS — K219 Gastro-esophageal reflux disease without esophagitis: Secondary | ICD-10-CM | POA: Diagnosis not present

## 2017-02-09 DIAGNOSIS — H6123 Impacted cerumen, bilateral: Secondary | ICD-10-CM | POA: Diagnosis not present

## 2017-02-09 DIAGNOSIS — E782 Mixed hyperlipidemia: Secondary | ICD-10-CM | POA: Diagnosis not present

## 2017-02-09 DIAGNOSIS — K219 Gastro-esophageal reflux disease without esophagitis: Secondary | ICD-10-CM | POA: Diagnosis not present

## 2017-02-09 DIAGNOSIS — Z6824 Body mass index (BMI) 24.0-24.9, adult: Secondary | ICD-10-CM | POA: Diagnosis not present

## 2017-02-09 DIAGNOSIS — R300 Dysuria: Secondary | ICD-10-CM | POA: Diagnosis not present

## 2017-02-09 DIAGNOSIS — N3000 Acute cystitis without hematuria: Secondary | ICD-10-CM | POA: Diagnosis not present

## 2017-02-09 DIAGNOSIS — E039 Hypothyroidism, unspecified: Secondary | ICD-10-CM | POA: Diagnosis not present

## 2017-02-09 DIAGNOSIS — Z23 Encounter for immunization: Secondary | ICD-10-CM | POA: Diagnosis not present

## 2017-03-15 DIAGNOSIS — H43811 Vitreous degeneration, right eye: Secondary | ICD-10-CM | POA: Diagnosis not present

## 2017-03-15 DIAGNOSIS — Z961 Presence of intraocular lens: Secondary | ICD-10-CM | POA: Diagnosis not present

## 2017-05-03 DIAGNOSIS — J0101 Acute recurrent maxillary sinusitis: Secondary | ICD-10-CM | POA: Diagnosis not present

## 2017-05-03 DIAGNOSIS — Z6824 Body mass index (BMI) 24.0-24.9, adult: Secondary | ICD-10-CM | POA: Diagnosis not present

## 2017-05-18 DIAGNOSIS — Z6823 Body mass index (BMI) 23.0-23.9, adult: Secondary | ICD-10-CM | POA: Diagnosis not present

## 2017-05-18 DIAGNOSIS — N3001 Acute cystitis with hematuria: Secondary | ICD-10-CM | POA: Diagnosis not present

## 2017-08-09 DIAGNOSIS — K219 Gastro-esophageal reflux disease without esophagitis: Secondary | ICD-10-CM | POA: Diagnosis not present

## 2017-08-09 DIAGNOSIS — E782 Mixed hyperlipidemia: Secondary | ICD-10-CM | POA: Diagnosis not present

## 2017-08-09 DIAGNOSIS — E039 Hypothyroidism, unspecified: Secondary | ICD-10-CM | POA: Diagnosis not present

## 2017-08-12 DIAGNOSIS — E039 Hypothyroidism, unspecified: Secondary | ICD-10-CM | POA: Diagnosis not present

## 2017-08-12 DIAGNOSIS — Z1212 Encounter for screening for malignant neoplasm of rectum: Secondary | ICD-10-CM | POA: Diagnosis not present

## 2017-08-12 DIAGNOSIS — N183 Chronic kidney disease, stage 3 (moderate): Secondary | ICD-10-CM | POA: Diagnosis not present

## 2017-08-12 DIAGNOSIS — K219 Gastro-esophageal reflux disease without esophagitis: Secondary | ICD-10-CM | POA: Diagnosis not present

## 2017-08-12 DIAGNOSIS — Z6824 Body mass index (BMI) 24.0-24.9, adult: Secondary | ICD-10-CM | POA: Diagnosis not present

## 2017-08-12 DIAGNOSIS — E782 Mixed hyperlipidemia: Secondary | ICD-10-CM | POA: Diagnosis not present

## 2017-08-16 DIAGNOSIS — M85851 Other specified disorders of bone density and structure, right thigh: Secondary | ICD-10-CM | POA: Diagnosis not present

## 2017-08-16 DIAGNOSIS — M810 Age-related osteoporosis without current pathological fracture: Secondary | ICD-10-CM | POA: Diagnosis not present

## 2017-11-11 DIAGNOSIS — Z6825 Body mass index (BMI) 25.0-25.9, adult: Secondary | ICD-10-CM | POA: Diagnosis not present

## 2017-11-11 DIAGNOSIS — R300 Dysuria: Secondary | ICD-10-CM | POA: Diagnosis not present

## 2017-11-11 DIAGNOSIS — N3000 Acute cystitis without hematuria: Secondary | ICD-10-CM | POA: Diagnosis not present

## 2017-12-30 ENCOUNTER — Other Ambulatory Visit: Payer: Self-pay

## 2018-02-07 DIAGNOSIS — N183 Chronic kidney disease, stage 3 (moderate): Secondary | ICD-10-CM | POA: Diagnosis not present

## 2018-02-07 DIAGNOSIS — E782 Mixed hyperlipidemia: Secondary | ICD-10-CM | POA: Diagnosis not present

## 2018-02-07 DIAGNOSIS — E039 Hypothyroidism, unspecified: Secondary | ICD-10-CM | POA: Diagnosis not present

## 2018-02-07 DIAGNOSIS — K219 Gastro-esophageal reflux disease without esophagitis: Secondary | ICD-10-CM | POA: Diagnosis not present

## 2018-02-09 DIAGNOSIS — Z0001 Encounter for general adult medical examination with abnormal findings: Secondary | ICD-10-CM | POA: Diagnosis not present

## 2018-02-09 DIAGNOSIS — Z6824 Body mass index (BMI) 24.0-24.9, adult: Secondary | ICD-10-CM | POA: Diagnosis not present

## 2018-02-09 DIAGNOSIS — Z23 Encounter for immunization: Secondary | ICD-10-CM | POA: Diagnosis not present

## 2018-02-09 DIAGNOSIS — I10 Essential (primary) hypertension: Secondary | ICD-10-CM | POA: Diagnosis not present

## 2018-02-18 DIAGNOSIS — Z6826 Body mass index (BMI) 26.0-26.9, adult: Secondary | ICD-10-CM | POA: Diagnosis not present

## 2018-02-18 DIAGNOSIS — L0291 Cutaneous abscess, unspecified: Secondary | ICD-10-CM | POA: Diagnosis not present

## 2018-02-18 DIAGNOSIS — L0390 Cellulitis, unspecified: Secondary | ICD-10-CM | POA: Diagnosis not present

## 2018-02-22 DIAGNOSIS — D485 Neoplasm of uncertain behavior of skin: Secondary | ICD-10-CM | POA: Diagnosis not present

## 2018-02-22 DIAGNOSIS — Z6825 Body mass index (BMI) 25.0-25.9, adult: Secondary | ICD-10-CM | POA: Diagnosis not present

## 2018-02-22 DIAGNOSIS — G309 Alzheimer's disease, unspecified: Secondary | ICD-10-CM | POA: Diagnosis not present

## 2018-02-25 DIAGNOSIS — C44722 Squamous cell carcinoma of skin of right lower limb, including hip: Secondary | ICD-10-CM | POA: Diagnosis not present

## 2018-02-25 DIAGNOSIS — L989 Disorder of the skin and subcutaneous tissue, unspecified: Secondary | ICD-10-CM | POA: Diagnosis not present

## 2018-02-25 DIAGNOSIS — C7650 Malignant neoplasm of unspecified lower limb: Secondary | ICD-10-CM | POA: Diagnosis not present

## 2018-02-25 DIAGNOSIS — C44711 Basal cell carcinoma of skin of unspecified lower limb, including hip: Secondary | ICD-10-CM | POA: Diagnosis not present

## 2018-02-25 DIAGNOSIS — C44721 Squamous cell carcinoma of skin of unspecified lower limb, including hip: Secondary | ICD-10-CM | POA: Diagnosis not present

## 2018-03-08 DIAGNOSIS — Z6825 Body mass index (BMI) 25.0-25.9, adult: Secondary | ICD-10-CM | POA: Diagnosis not present

## 2018-03-08 DIAGNOSIS — F419 Anxiety disorder, unspecified: Secondary | ICD-10-CM | POA: Diagnosis not present

## 2018-03-17 DIAGNOSIS — C7650 Malignant neoplasm of unspecified lower limb: Secondary | ICD-10-CM | POA: Diagnosis not present

## 2018-03-17 DIAGNOSIS — C44721 Squamous cell carcinoma of skin of unspecified lower limb, including hip: Secondary | ICD-10-CM | POA: Diagnosis not present

## 2018-03-17 DIAGNOSIS — C44722 Squamous cell carcinoma of skin of right lower limb, including hip: Secondary | ICD-10-CM | POA: Diagnosis not present

## 2018-04-05 DIAGNOSIS — H04123 Dry eye syndrome of bilateral lacrimal glands: Secondary | ICD-10-CM | POA: Diagnosis not present

## 2018-04-30 DIAGNOSIS — F331 Major depressive disorder, recurrent, moderate: Secondary | ICD-10-CM | POA: Diagnosis not present

## 2018-04-30 DIAGNOSIS — I10 Essential (primary) hypertension: Secondary | ICD-10-CM | POA: Diagnosis not present

## 2018-04-30 DIAGNOSIS — K219 Gastro-esophageal reflux disease without esophagitis: Secondary | ICD-10-CM | POA: Diagnosis not present

## 2018-04-30 DIAGNOSIS — Z6825 Body mass index (BMI) 25.0-25.9, adult: Secondary | ICD-10-CM | POA: Diagnosis not present

## 2018-05-28 DIAGNOSIS — R531 Weakness: Secondary | ICD-10-CM | POA: Diagnosis not present

## 2018-05-28 DIAGNOSIS — E039 Hypothyroidism, unspecified: Secondary | ICD-10-CM | POA: Diagnosis not present

## 2018-05-28 DIAGNOSIS — Z6825 Body mass index (BMI) 25.0-25.9, adult: Secondary | ICD-10-CM | POA: Diagnosis not present

## 2018-08-05 DIAGNOSIS — E782 Mixed hyperlipidemia: Secondary | ICD-10-CM | POA: Diagnosis not present

## 2018-08-05 DIAGNOSIS — K219 Gastro-esophageal reflux disease without esophagitis: Secondary | ICD-10-CM | POA: Diagnosis not present

## 2018-08-05 DIAGNOSIS — G309 Alzheimer's disease, unspecified: Secondary | ICD-10-CM | POA: Diagnosis not present

## 2018-08-05 DIAGNOSIS — N183 Chronic kidney disease, stage 3 (moderate): Secondary | ICD-10-CM | POA: Diagnosis not present

## 2018-08-05 DIAGNOSIS — I10 Essential (primary) hypertension: Secondary | ICD-10-CM | POA: Diagnosis not present

## 2018-08-05 DIAGNOSIS — E039 Hypothyroidism, unspecified: Secondary | ICD-10-CM | POA: Diagnosis not present

## 2018-08-08 DIAGNOSIS — Z6825 Body mass index (BMI) 25.0-25.9, adult: Secondary | ICD-10-CM | POA: Diagnosis not present

## 2018-08-08 DIAGNOSIS — K219 Gastro-esophageal reflux disease without esophagitis: Secondary | ICD-10-CM | POA: Diagnosis not present

## 2018-08-08 DIAGNOSIS — F331 Major depressive disorder, recurrent, moderate: Secondary | ICD-10-CM | POA: Diagnosis not present

## 2018-08-08 DIAGNOSIS — E782 Mixed hyperlipidemia: Secondary | ICD-10-CM | POA: Diagnosis not present

## 2018-08-08 DIAGNOSIS — G309 Alzheimer's disease, unspecified: Secondary | ICD-10-CM | POA: Diagnosis not present

## 2018-08-08 DIAGNOSIS — I10 Essential (primary) hypertension: Secondary | ICD-10-CM | POA: Diagnosis not present

## 2018-08-08 DIAGNOSIS — H6123 Impacted cerumen, bilateral: Secondary | ICD-10-CM | POA: Diagnosis not present

## 2018-11-07 DIAGNOSIS — F331 Major depressive disorder, recurrent, moderate: Secondary | ICD-10-CM | POA: Diagnosis not present

## 2018-11-07 DIAGNOSIS — G309 Alzheimer's disease, unspecified: Secondary | ICD-10-CM | POA: Diagnosis not present

## 2018-11-07 DIAGNOSIS — K219 Gastro-esophageal reflux disease without esophagitis: Secondary | ICD-10-CM | POA: Diagnosis not present

## 2018-11-07 DIAGNOSIS — E782 Mixed hyperlipidemia: Secondary | ICD-10-CM | POA: Diagnosis not present

## 2018-11-07 DIAGNOSIS — Z6825 Body mass index (BMI) 25.0-25.9, adult: Secondary | ICD-10-CM | POA: Diagnosis not present

## 2018-11-07 DIAGNOSIS — I10 Essential (primary) hypertension: Secondary | ICD-10-CM | POA: Diagnosis not present

## 2018-11-08 ENCOUNTER — Other Ambulatory Visit: Payer: Self-pay

## 2018-11-08 ENCOUNTER — Other Ambulatory Visit: Payer: Self-pay | Admitting: *Deleted

## 2018-11-08 NOTE — Patient Outreach (Signed)
[facility]) Care Management  11/08/2018  Girlie Veltri Warnick [birth year] [id number]   Telephone Screen  Referral Date: 11/08/18 Referral Source: MD referral  Referral Reason: Medication management and dementia Please call Ms. Hipps's daughter [name] [phone],  Referral is in Epic, This is for the Dementia part not for the medication part, Lennette Bihari has contact Courtney at [employer] to let her know she is needing to send the medication referral to [place] [facility], from Denman George, LPN [phone]  Insurance: Health team advantage (HTA)    Outreach attempt # 1 A  [facility] RN CM left HIPAA compliant voicemail message along with CM's contact info at [phone] wit a female family member  Outreach attempt # 1 B was successful to Thornell Mule, Daughter  [name] is able to verify HIPAA Reviewed and addressed referral to [facility] with Dorann Ou confirms Dr Quillian Quince was seen on 11/07/18 and the referral to [facility] was discussed    Social: Mrs Ruff is widowed and lives at her home alone with her daughter, [name] visiting her daily. She is able to bathe, feed and dress herself. [name] reports she is afraid of falling (but never had a fall) in her tub after a hip surgery about 6 years ago so Mrs Monnier's preference is to bathe from the sink. Mrs Kiss and [name] have discussed Mrs Moro remaining at home with care in the home prn vs facilities. The patient wants to continue to live at home alone but loves to socialize with visits or telephone calls. [name] reports Mrs Martinovich would ask people to stay with her all day. [name] reports there is a local female who does assist Mrs Currin in the home about 3 times a week but this person is not reliable per [name].   Mrs Whitsel has never driven a car therefore [name] or others provide all transportation to medical appointments and for errands. Mrs Mckenny does like to get out of her home. IN the last few months she has been out only to small local  companies with her masks and other precautions  Mr Legere likes to talk and does not want to read, play games or watch tv per Dorann Ou reports her memory concerns are new and started around September-October 2019.  She has not been seen by a neurologist only by Dr Quillian Quince. [name] and CM discussed neurologists  Conditions: s/p TRK replacement using cement, PAC, MDD(on Zoloft) , GERD, anxiety, dementia, hx of cellulitis, HLD hyperthyroidism No falls   DME: cane   Medications: She.denies concerns with affording medications, side effects of medications and questions about medications  There are noted issues with taking medications as prescribed [name] reports noting the Mrs Saner has not been able to appropriately take medications on her own. She has been noted to mix up medicines and  has forgotten to take medications even when [name] has placed them in medicine containers. [name] had started coming over twice a day to administer medicines but Mrs Santelli is "not liking" this   Last flu shot was in fall 2019 Generally gets flu shot yearly in October-November at PCP or [place]  Appointments: Saw Dr Quillian Quince on 11/07/18    Advance Directives:  [name] is the HPOA and she has a living will   Consent: [place] reviewed [facility] services with patient. Patient gave verbal consent for services [facility] telephonic RN CM, [facility] and [facility] pharmacy .   Plan: [facility] RN CM  will referral Mrs Stutsman to [facility] for assistance with possible community socialization/support programs, personal care services resources. Her daughter is reports that personal care services may not be needed all day long, possible 5-6 hours a day/2-3 days a week, want someone who may be able to offer to make sure she is taking her medications correct, and/or take Mrs Pennie on outings or to complete errands  [facility] RN CM will refer Mrs Elwell to [place] to assist with medication reconciliation and a possible resource to assist with medication  management and adherence concerns  [facility] RN CM will close case the [facility] telephonic encounter at this time   [name] encouraged to return a call to [facility] RN CM prn  [facility] RN CM sent a successful outreach letter as discussed with [facility] brochure enclosed for review  Routed not to MD    [place]. Lavina Hamman, RN, BSN, [place] Coordinator Office number [phone] Mobile number [phone]  Main [facility] number [phone] Fax number [phone]

## 2018-11-09 ENCOUNTER — Other Ambulatory Visit: Payer: Self-pay

## 2018-11-09 NOTE — Patient Outreach (Signed)
[facility]) Care Management  11/09/2018  Natalyia Innes Mctigue [birth year] [id number]   Successful outreach to patient's daughter regarding social work referral.  Referral stated:  "Patient needs assistance with possible community socialization/support programs and personal care services resources. Her daughter reports that personal care services may not be needed all day long, possible 5-6 hours a day/2-3 days a week, want someone who may be able to offer to make sure she is taking her medications correct, and/or take patient on outings or to complete errands". BSW and Daughter discussed specifics of [place] and [employer].  Daughter feels that [employer] are appropriate for patient's needs at this time.  During call, daughter reported that she has been in communication with Waymond Cera at [place] but she is unsure of status of services. BSW agreed to contact [name] to get an update.  BSW messaged [place].  BSW and daughter discussed patient applying for Medicaid due to certain services being covered by Medicaid that are not covered by Medicare.  Patient is slightly over the income limit but daughter requested that link to online application be emailed to her.  Link sent via secure email. BSW will follow up with daughter when response is received from [place] at [place] about [employer].   Ronn Melena, BSW Social Worker [phone]

## 2018-11-14 ENCOUNTER — Other Ambulatory Visit: Payer: Self-pay

## 2018-11-14 NOTE — Patient Outreach (Signed)
[facility]) Care Management  11/14/2018  Rayanne Padmanabhan Henningsen March 21, 1932 [id number]   BSW received communication from Waymond Cera at [place] that person over the department for [employer] is currently on vacation.  [name] will follow up upon their return.  BSW left voicemail message for patient's spouse, [name].  Will follow up with her again when response is received from [place].  Ronn Melena, BSW Social Worker [phone]

## 2018-11-16 ENCOUNTER — Other Ambulatory Visit: Payer: Self-pay

## 2018-11-16 NOTE — Patient Outreach (Signed)
[facility]) Care Management  11/16/2018  Danielle Barnes November 27, 1931 [id number]   Incoming call from patient's daughter, Danielle Barnes.  She was contacted by Waymond Cera at [facility] regarding request for [facility]. An aide was located but, unfortunately, had a family emergency and is no longer available.  Per daughter, [name] will continue to look for an available aide and follow up with her as soon as possible.  BSW will follow up with [name] and daughter again next week.  Ronn Melena, BSW Social Worker [phone]

## 2018-11-21 ENCOUNTER — Ambulatory Visit: Payer: PPO

## 2018-11-25 ENCOUNTER — Other Ambulatory Visit: Payer: Self-pay

## 2018-11-25 NOTE — Patient Outreach (Addendum)
Danielle Barnes [facility]) Care Management  11/25/2018  Danielle Barnes 26-Jun-1931 [id number]   BSW messaged Danielle Barnes with [facility] regarding status of referral for [employer].  Was provided contact information for Danielle Barnes to follow up with.  BSW left voicemail message for her.  Will call again next week if no response.  Successful outreach to patient's daughter, Danielle Barnes.  BSW informed her that [place] Barnes is now the appropriate person to communicate with regarding [employer].  BSW provided [place] with her contact information.  Daughter is seeking an assessment for patient to better understand where patient is in the progression of her disease.  BSW collaborated with [employer], Danielle Barnes,  about this.  During Danielle Barnes's initial outreach she inquired about whether or not patient has ever had neurology consult and daughter reported that she had not.  Per Danielle Barnes's note, they discussed neurologists.  BSW attempted to call daughter back to discuss neurology consult but had to leave a voicemail message.  Will attempt to reach her again next week.   Addendum: BSW received return call from  Barnes at [facility] regarding status of referral for [employer].  She currently does not have any aides available for this service but will potentially have an option within the next 2-3 weeks.  Per Danielle Barnes and Danielle Barnes are agencies in the area that provide [employer] but will likely not be in the price range that patient's daughter is seeking.  BSW contacted daughter and informed her of this information.  BSW also encouraged her to contact Danielle Barnes to request a referral for neurology consult.   BSW will follow up with daughter again within the next three weeks.     Danielle Barnes, BSW Social Worker [phone]

## 2018-11-28 ENCOUNTER — Ambulatory Visit: Payer: PPO

## 2018-11-30 DIAGNOSIS — R300 Dysuria: Secondary | ICD-10-CM | POA: Diagnosis not present

## 2018-12-05 DIAGNOSIS — F331 Major depressive disorder, recurrent, moderate: Secondary | ICD-10-CM | POA: Diagnosis not present

## 2018-12-05 DIAGNOSIS — Z6825 Body mass index (BMI) 25.0-25.9, adult: Secondary | ICD-10-CM | POA: Diagnosis not present

## 2018-12-05 DIAGNOSIS — G309 Alzheimer's disease, unspecified: Secondary | ICD-10-CM | POA: Diagnosis not present

## 2018-12-12 ENCOUNTER — Other Ambulatory Visit: Payer: Self-pay

## 2018-12-12 NOTE — Patient Outreach (Signed)
[facility]) Care Management  12/12/2018  Danielle Barnes 01/17/1932 [id number]   Successful follow up call to patient's daughter regarding status of in-home aide services.  A referral was previously submitted to [facility] for [facility], however, they don't have staff available at this time.  Since our last conversation, daughter was able to locate two individuals to provide in-home support for four hours per day.  She stated that things are going very well with these aides thus far.  Daughter reported that her sister filed a report with Adult Protective Services because she feels that patient should be in memory care facility.  The home visit/assessment with the social worker has not yet occurred.  Daughter stated that she wants what is best for her mother but she does not think that placement is necessary at this time especially now that she is getting in-home support.  Patient has an appointment at [facility] on 01/10/19.  Daughter states that she is hoping to have a better understanding after this appointment of where her mother is in the progression of her disease and what services/care is best for her. BSW is closing case at this time but encouraged daughter to call if additional needs arise.  Ronn Melena, BSW Social Worker [phone]

## 2018-12-26 DIAGNOSIS — I10 Essential (primary) hypertension: Secondary | ICD-10-CM | POA: Diagnosis not present

## 2018-12-26 DIAGNOSIS — F331 Major depressive disorder, recurrent, moderate: Secondary | ICD-10-CM | POA: Diagnosis not present

## 2018-12-26 DIAGNOSIS — G309 Alzheimer's disease, unspecified: Secondary | ICD-10-CM | POA: Diagnosis not present

## 2018-12-26 DIAGNOSIS — E782 Mixed hyperlipidemia: Secondary | ICD-10-CM | POA: Diagnosis not present

## 2018-12-26 DIAGNOSIS — K219 Gastro-esophageal reflux disease without esophagitis: Secondary | ICD-10-CM | POA: Diagnosis not present

## 2018-12-26 DIAGNOSIS — Z6826 Body mass index (BMI) 26.0-26.9, adult: Secondary | ICD-10-CM | POA: Diagnosis not present

## 2018-12-30 DIAGNOSIS — I10 Essential (primary) hypertension: Secondary | ICD-10-CM | POA: Diagnosis not present

## 2018-12-30 DIAGNOSIS — F331 Major depressive disorder, recurrent, moderate: Secondary | ICD-10-CM | POA: Diagnosis not present

## 2019-01-10 ENCOUNTER — Ambulatory Visit: Payer: PPO | Admitting: Neurology

## 2019-01-10 ENCOUNTER — Other Ambulatory Visit: Payer: Self-pay

## 2019-01-10 ENCOUNTER — Encounter: Payer: Self-pay | Admitting: Neurology

## 2019-01-10 ENCOUNTER — Telehealth: Payer: Self-pay | Admitting: Neurology

## 2019-01-10 VITALS — BP 105/61 | HR 77 | Temp 98.6°F | Ht 66.0 in | Wt 149.5 lb

## 2019-01-10 DIAGNOSIS — R413 Other amnesia: Secondary | ICD-10-CM | POA: Diagnosis not present

## 2019-01-10 MED ORDER — MEMANTINE HCL 10 MG PO TABS: 10.0000 mg | ORAL_TABLET | Freq: Two times a day (BID) | ORAL | 11 refills | Status: DC

## 2019-01-10 NOTE — Telephone Encounter (Signed)
health team order sent to GI. No auth they will reach out to the patient to schedule.  

## 2019-01-10 NOTE — Progress Notes (Signed)
PATIENT: Danielle Barnes DOB: 1931/06/30  Chief Complaint  Patient presents with  . Memory Loss    MMSE 25/30 - 7 animals. She is here with her daughter, [name], due to concerns over worsening memory.   Marland Kitchen PCP    Caryl Bis, MD     HISTORICAL  Danielle Barnes is a 85 year old female, seen in request by her primary care physician Dr. Gar Ponto for evaluation of memory loss, initial evaluation was on January 10, 2019.  I have reviewed and summarized the referring note from the referring physician.  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  She was noted to have gradual onset of memory loss since 2019, tends to repeat herself, misplace things, sometimes frustrated easily, she also called 911 frequently for a while has the delusional idea that her brother is controlling her TV station when she noticed the blinking box attached to her TV, her symptoms has much improved after starting Abilify 2 mg in June 2020, previously she was treated with Aricept for about 6 months without significant improvement  She has mild gait abnormality due to left knee pain  Laboratory evaluations CMP creatinine of 1 LDL 153  REVIEW OF SYSTEMS: Full 14 system review of systems performed and notable only for as above All other review of systems were negative.  ALLERGIES: No Known Allergies  HOME MEDICATIONS: Current Outpatient Medications  Medication Sig Dispense Refill  . alendronate (FOSAMAX) 70 MG tablet Take 70 mg by mouth once a week. Take with a full glass of water on an empty stomach.    . ARIPiprazole (ABILIFY) 2 MG tablet Take 2 mg by mouth daily.     Marland Kitchen levothyroxine (SYNTHROID, LEVOTHROID) 125 MCG tablet Take 125 mcg by mouth daily.      Marland Kitchen omeprazole (PRILOSEC) 20 MG capsule Take 20 mg by mouth daily.      . sertraline (ZOLOFT) 100 MG tablet Take 100 mg by mouth daily.    . traZODone (DESYREL) 100 MG tablet Take 100 mg by mouth at bedtime.       No current facility-administered medications for this visit.     PAST MEDICAL HISTORY: Past Medical History:  Diagnosis Date  . Arthritis   . Depression   . GERD (gastroesophageal reflux disease)   . Hearing loss   . Hypercholesterolemia   . Knee pain   . Memory loss   . Thyroid nodule     PAST SURGICAL HISTORY: Past Surgical History:  Procedure Laterality Date  . KNEE SURGERY Right    replacement  . THYROID SURGERY     nodules - benign    FAMILY HISTORY: Family History  Problem Relation Age of Onset  . Stroke Mother   . Diabetes Sister   . Cancer Sister   . Heart disease Brother   . Heart attack Brother   . Heart attack Father     SOCIAL HISTORY: Social History   Socioeconomic History  . Marital status: Widowed    Spouse name: Not on file  . Number of children: 2  . Years of education: 7th grade  . Highest education level: Not  on file  Occupational History  . Occupation: Retired from [employer]  . Financial resource strain: Not on file  . Food insecurity    Worry: Not on file    Inability: Not on file  . Transportation needs    Medical: Not on file    Non-medical: Not on file  Tobacco Use  . Smoking status: Never Smoker  . Smokeless tobacco: Never Used  Substance and Sexual Activity  . Alcohol use: No  . Drug use: No  . Sexual activity: Not on file  Lifestyle  . Physical activity    Days per week: Not on file    Minutes per session: Not on file  . Stress: Not on file  Relationships  . Social Herbalist on phone: Not on file    Gets together: Not on file    Attends religious service: Not on file    Active member of club  or organization: Not on file    Attends meetings of clubs or organizations: Not on file    Relationship status: Not on file  . Intimate partner violence    Fear of current or ex partner: Not on file    Emotionally abused: Not on file    Physically abused: Not on file    Forced sexual activity: Not on file  Other Topics Concern  . Not on file  Social History Narrative   Lives at home alone.   Leftt-handed.   Caffeine use:  2 cups per day.     PHYSICAL EXAM   Vitals:   01/10/19 1326  BP: 105/61  Pulse: 77  Temp: 98.6 F (37 C)  Weight: 149 lb 8 oz (67.8 kg)  Height: 5\' 6"  (1.676 m)    Not recorded      Body mass index is 24.13 kg/m.  PHYSICAL EXAMNIATION:  Gen: NAD, conversant, well nourised, obese, well groomed                     Cardiovascular: Regular rate rhythm, no peripheral edema, warm, nontender. Eyes: Conjunctivae clear without exudates or hemorrhage Neck: Supple, no carotid bruits. Pulmonary: Clear to auscultation bilaterally   NEUROLOGICAL EXAM:  MMSE - Mini Mental State Exam 01/10/2019  Orientation to time 4  Orientation to Place 5  Registration 3  Attention/ Calculation 5  Recall 0  Language- name 2 objects 2  Language- repeat 1  Language- follow 3 step command 3  Language- read & follow direction 1  Write a sentence 1  Copy design 0  Total score 25   Animal naming 7 CRANIAL NERVES: CN II: Visual fields are full to confrontation.  Pupils are round equal and briskly reactive to light. CN III, IV, VI: extraocular movement are normal. No ptosis. CN V: Facial sensation is intact to pinprick in all 3 divisions bilaterally. Corneal responses are intact.  CN VII: Face is symmetric with normal eye closure and smile. CN VIII: Hearing is normal to rubbing fingers CN IX, X: Palate elevates symmetrically. Phonation is normal. CN XI: Head turning and shoulder shrug are intact CN XII: Tongue is midline with normal movements and no atrophy.  MOTOR:  There is no pronator drift of out-stretched arms. Muscle bulk and tone are normal. Muscle strength is normal.  REFLEXES: Reflexes are 2+ and symmetric at the biceps, triceps, knees, and ankles. Plantar responses are flexor.  SENSORY: Intact to light touch, pinprick, positional sensation and vibratory sensation are intact in  fingers and toes.  COORDINATION: Rapid alternating movements and fine finger movements are intact. There is no dysmetria on finger-to-nose and heel-knee-shin.    GAIT/STANCE: She needs push-up to get up from seated position, mildly antalgic   DIAGNOSTIC DATA (LABS, IMAGING, TESTING) - I reviewed patient records, labs, notes, testing and imaging myself where available.   ASSESSMENT AND PLAN  Danielle Barnes is a 85 y.o. female    Mild cognitive impairment  Strong family history of dementia  Complete evaluation with MRI of the brain  Laboratory evaluation including B12, TSH  Starting Namenda 10 mg twice a day  Marcial Pacas, M.D. Ph.D.  [facility] [address] [place], [place] [address] Ph: [phone] Fax: [phone]  CC: Caryl Bis, MD

## 2019-01-11 ENCOUNTER — Telehealth: Payer: Self-pay | Admitting: *Deleted

## 2019-01-11 LAB — TSH: TSH: 4.08 u[IU]/mL (ref 0.450–4.500)

## 2019-01-11 LAB — VITAMIN B12: Vitamin B-12: 817 pg/mL (ref 232–1245)

## 2019-01-11 NOTE — Telephone Encounter (Signed)
-----   Message from Marcial Pacas, MD sent at 01/11/2019  3:46 PM EDT ----- Please call patient for normal laboratory result

## 2019-01-11 NOTE — Telephone Encounter (Signed)
Spoke to patient's daughter on [place] and she is aware of the lab results.

## 2019-01-30 DIAGNOSIS — I10 Essential (primary) hypertension: Secondary | ICD-10-CM | POA: Diagnosis not present

## 2019-01-30 DIAGNOSIS — E782 Mixed hyperlipidemia: Secondary | ICD-10-CM | POA: Diagnosis not present

## 2019-02-04 ENCOUNTER — Ambulatory Visit
Admission: RE | Admit: 2019-02-04 | Discharge: 2019-02-04 | Disposition: A | Discharge status: Home / Self Care | Payer: PPO | Source: Ambulatory Visit | Attending: Neurology | Admitting: Neurology

## 2019-02-04 ENCOUNTER — Other Ambulatory Visit: Payer: Self-pay

## 2019-02-04 DIAGNOSIS — R413 Other amnesia: Secondary | ICD-10-CM | POA: Diagnosis not present

## 2019-02-07 ENCOUNTER — Telehealth: Payer: Self-pay | Admitting: Neurology

## 2019-02-07 NOTE — Telephone Encounter (Signed)
Please call patient, MRI of the brain showed mild to moderate generalized atrophy, mild small vessel disease, I will review films at next follow-up visit   IMPRESSION: This MRI of the brain without contrast shows the following: 1.   Mild generalized cortical atrophy that is most pronounced in the parietal lobes and the medial temporal lobes.  This pattern is often seen with Alzheimer's disease. 2.   Mild chronic microvascular ischemic change. 3.   There were no acute findings.

## 2019-02-07 NOTE — Telephone Encounter (Signed)
I spoke to her daughter, Thornell Mule.  She was provided with the MRI results below.  She verbalized understanding. She will attend her mother's appt with her in November for further review.

## 2019-02-08 ENCOUNTER — Telehealth: Payer: Self-pay | Admitting: Neurology

## 2019-02-08 MED ORDER — MEMANTINE HCL 10 MG PO TABS: 10.0000 mg | ORAL_TABLET | Freq: Two times a day (BID) | ORAL | 11 refills | Status: DC

## 2019-02-08 NOTE — Telephone Encounter (Signed)
Pharmacy updated in Epic and refills sent.

## 2019-02-08 NOTE — Telephone Encounter (Signed)
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

## 2019-02-08 NOTE — Addendum Note (Signed)
Addended by: Noberto Retort C on: 02/08/2019 09:25 AM   Modules accepted: Orders

## 2019-02-20 DIAGNOSIS — G309 Alzheimer's disease, unspecified: Secondary | ICD-10-CM | POA: Diagnosis not present

## 2019-02-20 DIAGNOSIS — N183 Chronic kidney disease, stage 3 (moderate): Secondary | ICD-10-CM | POA: Diagnosis not present

## 2019-02-20 DIAGNOSIS — K219 Gastro-esophageal reflux disease without esophagitis: Secondary | ICD-10-CM | POA: Diagnosis not present

## 2019-02-20 DIAGNOSIS — E039 Hypothyroidism, unspecified: Secondary | ICD-10-CM | POA: Diagnosis not present

## 2019-02-20 DIAGNOSIS — E782 Mixed hyperlipidemia: Secondary | ICD-10-CM | POA: Diagnosis not present

## 2019-02-20 DIAGNOSIS — I10 Essential (primary) hypertension: Secondary | ICD-10-CM | POA: Diagnosis not present

## 2019-02-23 DIAGNOSIS — I10 Essential (primary) hypertension: Secondary | ICD-10-CM | POA: Diagnosis not present

## 2019-02-23 DIAGNOSIS — K219 Gastro-esophageal reflux disease without esophagitis: Secondary | ICD-10-CM | POA: Diagnosis not present

## 2019-02-23 DIAGNOSIS — E782 Mixed hyperlipidemia: Secondary | ICD-10-CM | POA: Diagnosis not present

## 2019-02-23 DIAGNOSIS — F331 Major depressive disorder, recurrent, moderate: Secondary | ICD-10-CM | POA: Diagnosis not present

## 2019-02-23 DIAGNOSIS — G309 Alzheimer's disease, unspecified: Secondary | ICD-10-CM | POA: Diagnosis not present

## 2019-02-23 DIAGNOSIS — Z0001 Encounter for general adult medical examination with abnormal findings: Secondary | ICD-10-CM | POA: Diagnosis not present

## 2019-02-23 DIAGNOSIS — Z23 Encounter for immunization: Secondary | ICD-10-CM | POA: Diagnosis not present

## 2019-02-23 DIAGNOSIS — Z6825 Body mass index (BMI) 25.0-25.9, adult: Secondary | ICD-10-CM | POA: Diagnosis not present

## 2019-02-24 DIAGNOSIS — M79604 Pain in right leg: Secondary | ICD-10-CM | POA: Diagnosis not present

## 2019-02-24 DIAGNOSIS — N183 Chronic kidney disease, stage 3 (moderate): Secondary | ICD-10-CM | POA: Diagnosis not present

## 2019-02-24 DIAGNOSIS — G309 Alzheimer's disease, unspecified: Secondary | ICD-10-CM | POA: Diagnosis not present

## 2019-02-24 DIAGNOSIS — R531 Weakness: Secondary | ICD-10-CM | POA: Diagnosis not present

## 2019-02-24 DIAGNOSIS — F0280 Dementia in other diseases classified elsewhere without behavioral disturbance: Secondary | ICD-10-CM | POA: Diagnosis not present

## 2019-02-24 DIAGNOSIS — I129 Hypertensive chronic kidney disease with stage 1 through stage 4 chronic kidney disease, or unspecified chronic kidney disease: Secondary | ICD-10-CM | POA: Diagnosis not present

## 2019-02-24 DIAGNOSIS — Z9181 History of falling: Secondary | ICD-10-CM | POA: Diagnosis not present

## 2019-02-24 DIAGNOSIS — E039 Hypothyroidism, unspecified: Secondary | ICD-10-CM | POA: Diagnosis not present

## 2019-03-01 DIAGNOSIS — I10 Essential (primary) hypertension: Secondary | ICD-10-CM | POA: Diagnosis not present

## 2019-03-01 DIAGNOSIS — E039 Hypothyroidism, unspecified: Secondary | ICD-10-CM | POA: Diagnosis not present

## 2019-03-02 DIAGNOSIS — G309 Alzheimer's disease, unspecified: Secondary | ICD-10-CM | POA: Diagnosis not present

## 2019-03-02 DIAGNOSIS — F0280 Dementia in other diseases classified elsewhere without behavioral disturbance: Secondary | ICD-10-CM | POA: Diagnosis not present

## 2019-03-02 DIAGNOSIS — N183 Chronic kidney disease, stage 3 unspecified: Secondary | ICD-10-CM | POA: Diagnosis not present

## 2019-03-02 DIAGNOSIS — I129 Hypertensive chronic kidney disease with stage 1 through stage 4 chronic kidney disease, or unspecified chronic kidney disease: Secondary | ICD-10-CM | POA: Diagnosis not present

## 2019-03-02 DIAGNOSIS — M79604 Pain in right leg: Secondary | ICD-10-CM | POA: Diagnosis not present

## 2019-03-02 DIAGNOSIS — E039 Hypothyroidism, unspecified: Secondary | ICD-10-CM | POA: Diagnosis not present

## 2019-03-02 DIAGNOSIS — R531 Weakness: Secondary | ICD-10-CM | POA: Diagnosis not present

## 2019-03-02 DIAGNOSIS — Z9181 History of falling: Secondary | ICD-10-CM | POA: Diagnosis not present

## 2019-03-09 DIAGNOSIS — M79675 Pain in left toe(s): Secondary | ICD-10-CM | POA: Diagnosis not present

## 2019-03-09 DIAGNOSIS — L110 Acquired keratosis follicularis: Secondary | ICD-10-CM | POA: Diagnosis not present

## 2019-03-09 DIAGNOSIS — M79674 Pain in right toe(s): Secondary | ICD-10-CM | POA: Diagnosis not present

## 2019-03-09 DIAGNOSIS — M79672 Pain in left foot: Secondary | ICD-10-CM | POA: Diagnosis not present

## 2019-03-09 DIAGNOSIS — M79671 Pain in right foot: Secondary | ICD-10-CM | POA: Diagnosis not present

## 2019-03-09 DIAGNOSIS — B351 Tinea unguium: Secondary | ICD-10-CM | POA: Diagnosis not present

## 2019-03-31 DIAGNOSIS — E039 Hypothyroidism, unspecified: Secondary | ICD-10-CM | POA: Diagnosis not present

## 2019-03-31 DIAGNOSIS — I10 Essential (primary) hypertension: Secondary | ICD-10-CM | POA: Diagnosis not present

## 2019-04-11 ENCOUNTER — Telehealth: Payer: Self-pay | Admitting: Neurology

## 2019-04-11 DIAGNOSIS — E039 Hypothyroidism, unspecified: Secondary | ICD-10-CM | POA: Diagnosis not present

## 2019-04-11 NOTE — Telephone Encounter (Signed)
LVM advising pt due to a schedule change her 11/16 appt has been c/a and r/s with NP Sarah for 12/3

## 2019-04-17 ENCOUNTER — Ambulatory Visit: Payer: PPO | Admitting: Neurology

## 2019-04-19 DIAGNOSIS — G309 Alzheimer's disease, unspecified: Secondary | ICD-10-CM | POA: Diagnosis not present

## 2019-04-19 DIAGNOSIS — M79604 Pain in right leg: Secondary | ICD-10-CM | POA: Diagnosis not present

## 2019-04-19 DIAGNOSIS — E039 Hypothyroidism, unspecified: Secondary | ICD-10-CM | POA: Diagnosis not present

## 2019-04-19 DIAGNOSIS — I129 Hypertensive chronic kidney disease with stage 1 through stage 4 chronic kidney disease, or unspecified chronic kidney disease: Secondary | ICD-10-CM | POA: Diagnosis not present

## 2019-04-19 DIAGNOSIS — N183 Chronic kidney disease, stage 3 unspecified: Secondary | ICD-10-CM | POA: Diagnosis not present

## 2019-04-19 DIAGNOSIS — F0280 Dementia in other diseases classified elsewhere without behavioral disturbance: Secondary | ICD-10-CM | POA: Diagnosis not present

## 2019-04-19 DIAGNOSIS — Z9181 History of falling: Secondary | ICD-10-CM | POA: Diagnosis not present

## 2019-04-19 DIAGNOSIS — R531 Weakness: Secondary | ICD-10-CM | POA: Diagnosis not present

## 2019-05-01 DIAGNOSIS — I10 Essential (primary) hypertension: Secondary | ICD-10-CM | POA: Diagnosis not present

## 2019-05-01 DIAGNOSIS — E039 Hypothyroidism, unspecified: Secondary | ICD-10-CM | POA: Diagnosis not present

## 2019-05-03 NOTE — Progress Notes (Signed)
PATIENT: Danielle Barnes DOB: 1931-06-09  REASON FOR VISIT: follow up HISTORY FROM: patient  HISTORY OF PRESENT ILLNESS: Today 05/04/19  HISTORY   Danielle Barnes is a 85 year old female, seen in request by her primary care physician Dr. Gar Ponto for evaluation of memory loss, initial evaluation was on January 10, 2019.  I have reviewed and summarized the referring note from the referring physician.  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  She was noted to have gradual onset of memory loss since 2019, tends to repeat herself, misplace things, sometimes frustrated easily, she also called 911 frequently for a while has the delusional idea that her brother is controlling her TV station when she noticed the blinking box attached to her TV, her symptoms has much improved after starting Abilify 2 mg in June 2020, previously she was treated with Aricept for about 6 months without significant improvement  She has mild gait abnormality due to left knee pain  Laboratory evaluations CMP creatinine of 1 LDL 153  Update May 04, 2019 SS: Laboratory evaluation TSH, B12 was normal, MRI of the brain showed mild to moderate generalized atrophy, mild small vessel disease. She remains on Namenda.   She continues to live alone, her daughter is close by.  She has friends who come by during the day to visit.  So far she has been doing well living alone.  She does not cook.  She has a good appetite.  Her daughter manages her medications.  She sleeps well at night, she has not had any falls.  She does all of her own ADLs, walks with a cane.  She does her  own laundry.  She does not drive a car.  She has been on Aricept, but was taken off in the past.  She is taking Abilify for history of delusions.  REVIEW OF SYSTEMS: Out of a complete 14 system review of symptoms, the patient complains only of the following symptoms, and all other reviewed systems are negative.  Memory loss  ALLERGIES: No Known Allergies  HOME MEDICATIONS: Outpatient Medications Prior to Visit  Medication Sig Dispense Refill  . alendronate (FOSAMAX) 70 MG tablet Take 70 mg by mouth once a week. Take with a full glass of water on an empty stomach.    . ARIPiprazole (ABILIFY) 2 MG tablet Take 2 mg by mouth daily.    Marland Kitchen levothyroxine (SYNTHROID, LEVOTHROID) 125 MCG tablet Take 125 mcg by mouth daily.      . memantine (NAMENDA) 10 MG tablet Take 1 tablet (10 mg total) by mouth 2 (two) times daily. 60 tablet 11  . omeprazole (PRILOSEC) 20 MG capsule Take 20 mg by mouth daily.      . sertraline (ZOLOFT) 100 MG tablet Take 100 mg by mouth daily.    . traZODone (DESYREL) 100 MG tablet Take 100 mg by mouth at bedtime.       No facility-administered medications prior to visit.     PAST MEDICAL HISTORY: Past Medical History:  Diagnosis Date  . Arthritis   . Depression   . GERD (gastroesophageal reflux disease)   . Hearing loss   .  Hypercholesterolemia   . Knee pain   . Memory loss   . Thyroid nodule     PAST SURGICAL HISTORY: Past Surgical History:  Procedure Laterality Date  . KNEE SURGERY Right    replacement  . THYROID SURGERY     nodules - benign    FAMILY HISTORY: Family History  Problem Relation Age of Onset  . Stroke Mother   . Diabetes Sister   . Cancer Sister   . Heart disease Brother   . Heart attack Brother   . Heart attack Father     SOCIAL HISTORY: Social History   Socioeconomic History  . Marital status: Widowed    Spouse name: Not on file  . Number of children: 2  . Years of education: 7th grade  . Highest education level: Not on  file  Occupational History  . Occupation: Retired from [employer]  . Financial resource strain: Not on file  . Food insecurity    Worry: Not on file    Inability: Not on file  . Transportation needs    Medical: Not on file    Non-medical: Not on file  Tobacco Use  . Smoking status: Never Smoker  . Smokeless tobacco: Never Used  Substance and Sexual Activity  . Alcohol use: No  . Drug use: No  . Sexual activity: Not on file  Lifestyle  . Physical activity    Days per week: Not on file    Minutes per session: Not on file  . Stress: Not on file  Relationships  . Social Herbalist on phone: Not on file    Gets together: Not on file    Attends religious service: Not on file    Active member of club or organization: Not on file    Attends meetings of clubs or organizations: Not on file    Relationship status: Not on file  . Intimate partner violence    Fear of current or ex partner: Not on file    Emotionally abused: Not on file    Physically abused: Not on file    Forced sexual activity: Not on file  Other Topics Concern  . Not on file  Social History Narrative   Lives at home alone.   Leftt-handed.   Caffeine use:  2 cups per day.    PHYSICAL EXAM  Vitals:   05/04/19 1240  Height: 5\' 6"  (1.676 m)   Body mass index is 24.13 kg/m.  Generalized: Well developed, in no acute distress  MMSE - Mini Mental State Exam 05/04/2019 01/10/2019  Not completed: (No Data) -  Orientation to time 2 4  Orientation to Place 3 5  Registration 3 3  Attention/ Calculation 0 5  Recall 0 0  Language- name 2 objects 2 2  Language- repeat 0 1  Language- repeat-comments she said ifs ands or buts -  Language- follow 3 step command 3 3  Language- read & follow direction 1 1  Write a sentence 1 1  Copy design 1 0  Total score 16 25    Neurological examination  Mentation: Alert oriented to time, place, most history is provided by her daughter, she is hard of  hearing. Follows all commands speech and language fluent Cranial nerve II-XII: Pupils were equal round reactive to light. Extraocular movements were full, visual field were full on confrontational test. Facial sensation and strength were normal.  Head turning and shoulder shrug  were normal and symmetric. Motor:  Good strength of all extremities Sensory: Sensory testing is intact to soft touch on all 4 extremities. No evidence of extinction is noted.  Coordination: Cerebellar testing reveals good finger-nose-finger and heel-to-shin bilaterally.  Gait and station: Gait is normal, uses a cane. Reflexes: Deep tendon reflexes are symmetric   DIAGNOSTIC DATA (LABS, IMAGING, TESTING) - I reviewed patient records, labs, notes, testing and imaging myself where available.  Lab Results  Component Value Date   WBC 5.6 12/17/2010   HGB 9.9 (L) 12/17/2010   HCT 29.5 (L) 12/17/2010   MCV 94.2 12/17/2010   PLT 160 12/17/2010      Component Value Date/Time   NA 140 12/17/2010 0641   K 3.7 12/17/2010 0641   CL 105 12/17/2010 0641   CO2 30 12/17/2010 0641   GLUCOSE 113 (H) 12/17/2010 0641   BUN 9 12/17/2010 0641   CREATININE 0.62 12/17/2010 0641   CALCIUM 8.5 12/17/2010 0641   PROT 6.8 12/10/2010 1131   ALBUMIN 3.7 12/10/2010 1131   AST 21 12/10/2010 1131   ALT 16 12/10/2010 1131   ALKPHOS 91 12/10/2010 1131   BILITOT 0.3 12/10/2010 1131   GFRNONAA >60 12/17/2010 0641   GFRAA >60 12/17/2010 0641   No results found for: CHOL, HDL, LDLCALC, LDLDIRECT, TRIG, CHOLHDL No results found for: HGBA1C Lab Results  Component Value Date   VITAMINB12 817 01/10/2019   Lab Results  Component Value Date   TSH 4.080 01/10/2019      ASSESSMENT AND PLAN 85 y.o. year old female  has a past medical history of Arthritis, Depression, GERD (gastroesophageal reflux disease), Hearing loss, Hypercholesterolemia, Knee pain, Memory loss, and Thyroid nodule. here with:  1.  Mild cognitive impairment -Decline  in memory score, overall appears stable, continues to live alone and does well with this -Strong family history of dementia -MRI of the brain showed mild to moderate generalized atrophy, mild small vessel disease, pattern is consistent with Alzheimer's disease -B12, TSH were normal -Continue Namenda 10 mg twice a day -Encouraged brain stimulating exercises -Follow-up in 6 months or sooner if needed  I spent 15 minutes with the patient. 50% of this time was spent discussing her plan of care.   Butler Denmark, AGNP-C, DNP 05/04/2019, 12:44 PM [facility] [address] [place], [place] [address] [phone]

## 2019-05-04 ENCOUNTER — Ambulatory Visit (INDEPENDENT_AMBULATORY_CARE_PROVIDER_SITE_OTHER): Payer: PPO | Admitting: Neurology

## 2019-05-04 ENCOUNTER — Encounter: Payer: Self-pay | Admitting: Neurology

## 2019-05-04 ENCOUNTER — Other Ambulatory Visit: Payer: Self-pay

## 2019-05-04 DIAGNOSIS — R413 Other amnesia: Secondary | ICD-10-CM

## 2019-05-04 NOTE — Patient Instructions (Signed)
Continue the Namenda   Try to do some brain stimulating exercises during the day   Return in 6 months

## 2019-05-11 DIAGNOSIS — L989 Disorder of the skin and subcutaneous tissue, unspecified: Secondary | ICD-10-CM | POA: Diagnosis not present

## 2019-05-11 DIAGNOSIS — Z6826 Body mass index (BMI) 26.0-26.9, adult: Secondary | ICD-10-CM | POA: Diagnosis not present

## 2019-05-12 DIAGNOSIS — C44721 Squamous cell carcinoma of skin of unspecified lower limb, including hip: Secondary | ICD-10-CM | POA: Diagnosis not present

## 2019-05-12 DIAGNOSIS — C44722 Squamous cell carcinoma of skin of right lower limb, including hip: Secondary | ICD-10-CM | POA: Diagnosis not present

## 2019-05-29 DIAGNOSIS — E039 Hypothyroidism, unspecified: Secondary | ICD-10-CM | POA: Diagnosis not present

## 2019-06-01 DIAGNOSIS — G309 Alzheimer's disease, unspecified: Secondary | ICD-10-CM | POA: Diagnosis not present

## 2019-06-01 DIAGNOSIS — I10 Essential (primary) hypertension: Secondary | ICD-10-CM | POA: Diagnosis not present

## 2019-06-20 DIAGNOSIS — E782 Mixed hyperlipidemia: Secondary | ICD-10-CM | POA: Diagnosis not present

## 2019-06-20 DIAGNOSIS — G309 Alzheimer's disease, unspecified: Secondary | ICD-10-CM | POA: Diagnosis not present

## 2019-06-20 DIAGNOSIS — K219 Gastro-esophageal reflux disease without esophagitis: Secondary | ICD-10-CM | POA: Diagnosis not present

## 2019-06-20 DIAGNOSIS — I10 Essential (primary) hypertension: Secondary | ICD-10-CM | POA: Diagnosis not present

## 2019-06-20 DIAGNOSIS — E039 Hypothyroidism, unspecified: Secondary | ICD-10-CM | POA: Diagnosis not present

## 2019-06-20 DIAGNOSIS — F331 Major depressive disorder, recurrent, moderate: Secondary | ICD-10-CM | POA: Diagnosis not present

## 2019-06-20 DIAGNOSIS — Z6826 Body mass index (BMI) 26.0-26.9, adult: Secondary | ICD-10-CM | POA: Diagnosis not present

## 2019-06-30 DIAGNOSIS — G35 Multiple sclerosis: Secondary | ICD-10-CM | POA: Diagnosis not present

## 2019-06-30 DIAGNOSIS — I10 Essential (primary) hypertension: Secondary | ICD-10-CM | POA: Diagnosis not present

## 2019-07-11 DIAGNOSIS — E039 Hypothyroidism, unspecified: Secondary | ICD-10-CM | POA: Diagnosis not present

## 2019-07-19 DIAGNOSIS — H905 Unspecified sensorineural hearing loss: Secondary | ICD-10-CM | POA: Diagnosis not present

## 2019-07-28 DIAGNOSIS — I10 Essential (primary) hypertension: Secondary | ICD-10-CM | POA: Diagnosis not present

## 2019-07-28 DIAGNOSIS — E7849 Other hyperlipidemia: Secondary | ICD-10-CM | POA: Diagnosis not present

## 2019-07-28 DIAGNOSIS — E039 Hypothyroidism, unspecified: Secondary | ICD-10-CM | POA: Diagnosis not present

## 2019-08-02 NOTE — Progress Notes (Signed)
I have reviewed and agreed above plan. 

## 2019-08-22 NOTE — Patient Outreach (Signed)
Referral to [facility] to contact members daughter Thornell Mule @ [phone] to discuss options for care. LMComer

## 2019-08-25 DIAGNOSIS — Z9181 History of falling: Secondary | ICD-10-CM | POA: Diagnosis not present

## 2019-08-25 DIAGNOSIS — G309 Alzheimer's disease, unspecified: Secondary | ICD-10-CM | POA: Diagnosis not present

## 2019-08-25 DIAGNOSIS — Z7983 Long term (current) use of bisphosphonates: Secondary | ICD-10-CM | POA: Diagnosis not present

## 2019-08-25 DIAGNOSIS — I129 Hypertensive chronic kidney disease with stage 1 through stage 4 chronic kidney disease, or unspecified chronic kidney disease: Secondary | ICD-10-CM | POA: Diagnosis not present

## 2019-08-25 DIAGNOSIS — E039 Hypothyroidism, unspecified: Secondary | ICD-10-CM | POA: Diagnosis not present

## 2019-08-25 DIAGNOSIS — M6281 Muscle weakness (generalized): Secondary | ICD-10-CM | POA: Diagnosis not present

## 2019-08-25 DIAGNOSIS — M810 Age-related osteoporosis without current pathological fracture: Secondary | ICD-10-CM | POA: Diagnosis not present

## 2019-08-25 DIAGNOSIS — N189 Chronic kidney disease, unspecified: Secondary | ICD-10-CM | POA: Diagnosis not present

## 2019-08-25 DIAGNOSIS — F0280 Dementia in other diseases classified elsewhere without behavioral disturbance: Secondary | ICD-10-CM | POA: Diagnosis not present

## 2019-08-30 DIAGNOSIS — E039 Hypothyroidism, unspecified: Secondary | ICD-10-CM | POA: Diagnosis not present

## 2019-08-30 DIAGNOSIS — F331 Major depressive disorder, recurrent, moderate: Secondary | ICD-10-CM | POA: Diagnosis not present

## 2019-08-31 DIAGNOSIS — M6281 Muscle weakness (generalized): Secondary | ICD-10-CM | POA: Diagnosis not present

## 2019-08-31 DIAGNOSIS — E039 Hypothyroidism, unspecified: Secondary | ICD-10-CM | POA: Diagnosis not present

## 2019-08-31 DIAGNOSIS — G309 Alzheimer's disease, unspecified: Secondary | ICD-10-CM | POA: Diagnosis not present

## 2019-08-31 DIAGNOSIS — N189 Chronic kidney disease, unspecified: Secondary | ICD-10-CM | POA: Diagnosis not present

## 2019-08-31 DIAGNOSIS — F0280 Dementia in other diseases classified elsewhere without behavioral disturbance: Secondary | ICD-10-CM | POA: Diagnosis not present

## 2019-08-31 DIAGNOSIS — Z7983 Long term (current) use of bisphosphonates: Secondary | ICD-10-CM | POA: Diagnosis not present

## 2019-08-31 DIAGNOSIS — M810 Age-related osteoporosis without current pathological fracture: Secondary | ICD-10-CM | POA: Diagnosis not present

## 2019-08-31 DIAGNOSIS — Z9181 History of falling: Secondary | ICD-10-CM | POA: Diagnosis not present

## 2019-08-31 DIAGNOSIS — I129 Hypertensive chronic kidney disease with stage 1 through stage 4 chronic kidney disease, or unspecified chronic kidney disease: Secondary | ICD-10-CM | POA: Diagnosis not present

## 2019-09-05 DIAGNOSIS — R5383 Other fatigue: Secondary | ICD-10-CM | POA: Diagnosis not present

## 2019-09-05 DIAGNOSIS — F331 Major depressive disorder, recurrent, moderate: Secondary | ICD-10-CM | POA: Diagnosis not present

## 2019-09-05 DIAGNOSIS — Z6826 Body mass index (BMI) 26.0-26.9, adult: Secondary | ICD-10-CM | POA: Diagnosis not present

## 2019-09-05 DIAGNOSIS — F419 Anxiety disorder, unspecified: Secondary | ICD-10-CM | POA: Diagnosis not present

## 2019-09-05 DIAGNOSIS — G309 Alzheimer's disease, unspecified: Secondary | ICD-10-CM | POA: Diagnosis not present

## 2019-09-05 DIAGNOSIS — E039 Hypothyroidism, unspecified: Secondary | ICD-10-CM | POA: Diagnosis not present

## 2019-09-05 DIAGNOSIS — R531 Weakness: Secondary | ICD-10-CM | POA: Diagnosis not present

## 2019-09-05 DIAGNOSIS — I493 Ventricular premature depolarization: Secondary | ICD-10-CM | POA: Diagnosis not present

## 2019-09-11 DIAGNOSIS — E039 Hypothyroidism, unspecified: Secondary | ICD-10-CM | POA: Diagnosis not present

## 2019-09-11 DIAGNOSIS — M810 Age-related osteoporosis without current pathological fracture: Secondary | ICD-10-CM | POA: Diagnosis not present

## 2019-09-11 DIAGNOSIS — N189 Chronic kidney disease, unspecified: Secondary | ICD-10-CM | POA: Diagnosis not present

## 2019-09-11 DIAGNOSIS — Z7983 Long term (current) use of bisphosphonates: Secondary | ICD-10-CM | POA: Diagnosis not present

## 2019-09-11 DIAGNOSIS — I129 Hypertensive chronic kidney disease with stage 1 through stage 4 chronic kidney disease, or unspecified chronic kidney disease: Secondary | ICD-10-CM | POA: Diagnosis not present

## 2019-09-11 DIAGNOSIS — G309 Alzheimer's disease, unspecified: Secondary | ICD-10-CM | POA: Diagnosis not present

## 2019-09-11 DIAGNOSIS — M6281 Muscle weakness (generalized): Secondary | ICD-10-CM | POA: Diagnosis not present

## 2019-09-11 DIAGNOSIS — F0280 Dementia in other diseases classified elsewhere without behavioral disturbance: Secondary | ICD-10-CM | POA: Diagnosis not present

## 2019-09-29 DIAGNOSIS — G309 Alzheimer's disease, unspecified: Secondary | ICD-10-CM | POA: Diagnosis not present

## 2019-09-29 DIAGNOSIS — E039 Hypothyroidism, unspecified: Secondary | ICD-10-CM | POA: Diagnosis not present

## 2019-10-06 DIAGNOSIS — E782 Mixed hyperlipidemia: Secondary | ICD-10-CM | POA: Diagnosis not present

## 2019-10-06 DIAGNOSIS — I10 Essential (primary) hypertension: Secondary | ICD-10-CM | POA: Diagnosis not present

## 2019-10-06 DIAGNOSIS — K219 Gastro-esophageal reflux disease without esophagitis: Secondary | ICD-10-CM | POA: Diagnosis not present

## 2019-10-06 DIAGNOSIS — N183 Chronic kidney disease, stage 3 unspecified: Secondary | ICD-10-CM | POA: Diagnosis not present

## 2019-10-13 DIAGNOSIS — G309 Alzheimer's disease, unspecified: Secondary | ICD-10-CM | POA: Diagnosis not present

## 2019-10-13 DIAGNOSIS — F331 Major depressive disorder, recurrent, moderate: Secondary | ICD-10-CM | POA: Diagnosis not present

## 2019-10-13 DIAGNOSIS — I10 Essential (primary) hypertension: Secondary | ICD-10-CM | POA: Diagnosis not present

## 2019-10-13 DIAGNOSIS — E782 Mixed hyperlipidemia: Secondary | ICD-10-CM | POA: Diagnosis not present

## 2019-10-13 DIAGNOSIS — E039 Hypothyroidism, unspecified: Secondary | ICD-10-CM | POA: Diagnosis not present

## 2019-10-13 DIAGNOSIS — K219 Gastro-esophageal reflux disease without esophagitis: Secondary | ICD-10-CM | POA: Diagnosis not present

## 2019-10-13 DIAGNOSIS — Z6826 Body mass index (BMI) 26.0-26.9, adult: Secondary | ICD-10-CM | POA: Diagnosis not present

## 2019-10-30 DIAGNOSIS — N183 Chronic kidney disease, stage 3 unspecified: Secondary | ICD-10-CM | POA: Diagnosis not present

## 2019-10-30 DIAGNOSIS — E039 Hypothyroidism, unspecified: Secondary | ICD-10-CM | POA: Diagnosis not present

## 2019-10-30 DIAGNOSIS — E7849 Other hyperlipidemia: Secondary | ICD-10-CM | POA: Diagnosis not present

## 2019-10-30 DIAGNOSIS — I129 Hypertensive chronic kidney disease with stage 1 through stage 4 chronic kidney disease, or unspecified chronic kidney disease: Secondary | ICD-10-CM | POA: Diagnosis not present

## 2019-11-01 NOTE — Progress Notes (Deleted)
PATIENT: Ayshah Alva Ballo DOB: [birth year]  REASON FOR VISIT: follow up HISTORY FROM: patient  HISTORY OF PRESENT ILLNESS: Today 11/01/19  HISTORY   Gladene Isham Priddyis a 86 year old female, seen in request byher primary care physician Dr.Daniel, Terryfor evaluation of memory loss, initial evaluation was on January 10, 2019.  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  She was noted to have gradual onset of memory loss since 2019, tends to repeat herself, misplace things, sometimes frustrated easily, she also called 911 frequently for a while has the delusional idea that her brother is controlling her TV station when she noticed the blinking box attached to her TV, her symptoms has much improved after starting Abilify 2 mg in June 2020, previously she was treated with Aricept for about 6 months without significant improvement  She has mild gait abnormality due to left knee pain  Laboratory evaluations CMP creatinine of 1 LDL 153  Update May 04, 2019 SS: Laboratory evaluation TSH, B12 was normal, MRI of the brain showed mild to moderate generalized atrophy, mild small vessel disease. She remains on Namenda.   She continues to live alone, her daughter is close by.  She has friends who come by during the day to visit.  So far she has been doing well living alone.  She does not cook.  She has a good appetite.  Her daughter manages her medications.  She sleeps well at night, she has not had any falls.  She does all of her own ADLs, walks with a cane.  She does  her own laundry.  She does not drive a car.  She has been on Aricept, but was taken off in the past.  She is taking Abilify for history of delusions.  Update November 02, 2019 SS:    REVIEW OF SYSTEMS: Out of a complete 14 system review of symptoms, the patient complains only of the following symptoms, and all other reviewed systems are negative.  ALLERGIES: No Known Allergies  HOME MEDICATIONS: Outpatient Medications Prior to Visit  Medication Sig Dispense Refill   alendronate (FOSAMAX) 70 MG tablet Take 70 mg by mouth once a week. Take with a full glass of water on an empty stomach.     ARIPiprazole (ABILIFY) 2 MG tablet Take 2 mg by mouth daily.     levothyroxine (SYNTHROID, LEVOTHROID) 125 MCG tablet Take 125 mcg by mouth daily.       memantine (NAMENDA) 10 MG tablet Take 1 tablet (10 mg total) by mouth 2 (two) times daily. 60 tablet 11   omeprazole (PRILOSEC) 20 MG capsule Take 20 mg by mouth daily.       sertraline (ZOLOFT) 100 MG tablet Take 100 mg by mouth daily.     traZODone (DESYREL) 100 MG tablet Take 100 mg by mouth at bedtime.       No facility-administered medications prior to visit.    PAST MEDICAL HISTORY: Past Medical History:  Diagnosis Date   Arthritis    Depression    GERD (gastroesophageal reflux disease)    Hearing loss    Hypercholesterolemia  Knee pain    Memory loss    Thyroid nodule     PAST SURGICAL HISTORY: Past Surgical History:  Procedure Laterality Date   KNEE SURGERY Right    replacement   THYROID SURGERY     nodules - benign    FAMILY HISTORY: Family History  Problem Relation Age of Onset   Stroke Mother    Diabetes Sister    Cancer Sister    Heart disease Brother    Heart attack Brother    Heart attack Father     SOCIAL HISTORY: Social History   Socioeconomic History   Marital status: Widowed    Spouse name: Not on file   Number of children: 2   Years of education: 7th grade   Highest education  level: Not on file  Occupational History   Occupation: Retired from [employer] fundraiser  Tobacco Use   Smoking status: Never Smoker   Smokeless tobacco: Never Used  Substance and Sexual Activity   Alcohol use: No   Drug use: No   Sexual activity: Not on file  Other Topics Concern   Not on file  Social History Narrative   Lives at home alone.   Leftt-handed.   Caffeine use:  2 cups per day.   Social Determinants of Health   Financial Resource Strain:    Difficulty of Paying Living Expenses:   Food Insecurity:    Worried About Charity fundraiser in the Last Year:    Arboriculturist in the Last Year:   Transportation Needs:    Film/video editor (Medical):    Lack of Transportation (Non-Medical):   Physical Activity:    Days of Exercise per Week:    Minutes of Exercise per Session:   Stress:    Feeling of Stress :   Social Connections:    Frequency of Communication with Friends and Family:    Frequency of Social Gatherings with Friends and Family:    Attends Religious Services:    Active Member of Clubs or Organizations:    Attends Music therapist:    Marital Status:   Intimate Partner Violence:    Fear of Current or Ex-Partner:    Emotionally Abused:    Physically Abused:    Sexually Abused:       PHYSICAL EXAM  There were no vitals filed for this visit. There is no height or weight on file to calculate BMI.  Generalized: Well developed, in no acute distress   Neurological examination  Mentation: Alert oriented to time, place, history taking. Follows all commands speech and language fluent Cranial nerve II-XII: Pupils were equal round reactive to light. Extraocular movements were full, visual field were full on confrontational test. Facial sensation and strength were normal. Uvula tongue midline. Head turning and shoulder shrug  were normal and symmetric. Motor: The motor testing reveals 5 over 5 strength of all 4 extremities.  Good symmetric motor tone is noted throughout.  Sensory: Sensory testing is intact to soft touch on all 4 extremities. No evidence of extinction is noted.  Coordination: Cerebellar testing reveals good finger-nose-finger and heel-to-shin bilaterally.  Gait and station: Gait is normal. Tandem gait is normal. Romberg is negative. No drift is seen.  Reflexes: Deep tendon reflexes are symmetric and normal bilaterally.   DIAGNOSTIC DATA (LABS, IMAGING, TESTING) - I reviewed patient records, labs, notes, testing and imaging myself where available.  Lab Results  Component Value Date   WBC 5.6 12/17/2010   HGB  9.9 (L) 12/17/2010   HCT 29.5 (L) 12/17/2010   MCV 94.2 12/17/2010   PLT 160 12/17/2010      Component Value Date/Time   NA 140 12/17/2010 0641   K 3.7 12/17/2010 0641   CL 105 12/17/2010 0641   CO2 30 12/17/2010 0641   GLUCOSE 113 (H) 12/17/2010 0641   BUN 9 12/17/2010 0641   CREATININE 0.62 12/17/2010 0641   CALCIUM 8.5 12/17/2010 0641   PROT 6.8 12/10/2010 1131   ALBUMIN 3.7 12/10/2010 1131   AST 21 12/10/2010 1131   ALT 16 12/10/2010 1131   ALKPHOS 91 12/10/2010 1131   BILITOT 0.3 12/10/2010 1131   GFRNONAA >60 12/17/2010 0641   GFRAA >60 12/17/2010 0641   No results found for: CHOL, HDL, LDLCALC, LDLDIRECT, TRIG, CHOLHDL No results found for: HGBA1C Lab Results  Component Value Date   VITAMINB12 817 01/10/2019   Lab Results  Component Value Date   TSH 4.080 01/10/2019      ASSESSMENT AND PLAN 86 y.o. year old female  has a past medical history of Arthritis, Depression, GERD (gastroesophageal reflux disease), Hearing loss, Hypercholesterolemia, Knee pain, Memory loss, and Thyroid nodule. here with:  1.  Mild cognitive decline -Strong family history of dementia -MRI of the brain showed mild to moderate generalized atrophy, mild small vessel disease, pattern is consistent Alzheimer's disease -B12, TSH were normal -Continue Namenda 10 mg twice a day   I spent  15 minutes with the patient. 50% of this time was spent   Butler Denmark, Logan, DNP 11/01/2019, 12:25 PM [facility] [address] [place], [place] [address] [phone]

## 2019-11-02 ENCOUNTER — Ambulatory Visit: Payer: PPO | Admitting: Neurology

## 2019-11-02 ENCOUNTER — Encounter: Payer: Self-pay | Admitting: Neurology

## 2019-11-13 ENCOUNTER — Telehealth: Payer: Self-pay | Admitting: Adult Health

## 2019-11-13 NOTE — Telephone Encounter (Signed)
Patient's daughter called and left a voicemail requesting to reschedule her appointment.  Please call  I am working in the phone room helping out with voicemails

## 2019-11-28 DIAGNOSIS — G309 Alzheimer's disease, unspecified: Secondary | ICD-10-CM | POA: Diagnosis not present

## 2019-11-28 DIAGNOSIS — E039 Hypothyroidism, unspecified: Secondary | ICD-10-CM | POA: Diagnosis not present

## 2019-11-28 DIAGNOSIS — E782 Mixed hyperlipidemia: Secondary | ICD-10-CM | POA: Diagnosis not present

## 2019-11-28 DIAGNOSIS — I10 Essential (primary) hypertension: Secondary | ICD-10-CM | POA: Diagnosis not present

## 2019-11-28 DIAGNOSIS — K219 Gastro-esophageal reflux disease without esophagitis: Secondary | ICD-10-CM | POA: Diagnosis not present

## 2019-11-28 DIAGNOSIS — Z6826 Body mass index (BMI) 26.0-26.9, adult: Secondary | ICD-10-CM | POA: Diagnosis not present

## 2019-11-28 DIAGNOSIS — F331 Major depressive disorder, recurrent, moderate: Secondary | ICD-10-CM | POA: Diagnosis not present

## 2019-11-28 DIAGNOSIS — M25512 Pain in left shoulder: Secondary | ICD-10-CM | POA: Diagnosis not present

## 2019-11-29 DIAGNOSIS — I129 Hypertensive chronic kidney disease with stage 1 through stage 4 chronic kidney disease, or unspecified chronic kidney disease: Secondary | ICD-10-CM | POA: Diagnosis not present

## 2019-11-29 DIAGNOSIS — E039 Hypothyroidism, unspecified: Secondary | ICD-10-CM | POA: Diagnosis not present

## 2019-11-29 DIAGNOSIS — E7849 Other hyperlipidemia: Secondary | ICD-10-CM | POA: Diagnosis not present

## 2019-11-29 DIAGNOSIS — N183 Chronic kidney disease, stage 3 unspecified: Secondary | ICD-10-CM | POA: Diagnosis not present

## 2019-12-26 ENCOUNTER — Encounter: Payer: Self-pay | Admitting: Neurology

## 2019-12-26 ENCOUNTER — Ambulatory Visit: Payer: PPO | Admitting: Neurology

## 2019-12-26 ENCOUNTER — Other Ambulatory Visit: Payer: Self-pay | Admitting: Neurology

## 2019-12-26 ENCOUNTER — Other Ambulatory Visit: Payer: Self-pay

## 2019-12-26 VITALS — BP 129/74 | HR 78 | Ht 64.0 in | Wt 154.0 lb

## 2019-12-26 DIAGNOSIS — F0390 Unspecified dementia without behavioral disturbance: Secondary | ICD-10-CM | POA: Diagnosis not present

## 2019-12-26 DIAGNOSIS — R413 Other amnesia: Secondary | ICD-10-CM | POA: Diagnosis not present

## 2019-12-26 MED ORDER — MEMANTINE HCL 10 MG PO TABS: 10.0000 mg | ORAL_TABLET | Freq: Two times a day (BID) | ORAL | 11 refills | Status: DC

## 2019-12-26 NOTE — Patient Instructions (Signed)
Continue Namenda  Memory seems overall stable Continue seeing primary doctor Return here as needed

## 2019-12-26 NOTE — Progress Notes (Signed)
PATIENT: Danielle Barnes DOB: July 04, 1931  REASON FOR VISIT: follow up HISTORY FROM: patient  HISTORY OF PRESENT ILLNESS: Today 12/26/19  HISTORY Danielle Rollo Priddyis a 86 year old female, seen in request byher primary care physician Dr.Daniel, Terryfor evaluation of memory loss, initial evaluation was on January 10, 2019.  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  She was noted to have gradual onset of memory loss since 2019, tends to repeat herself, misplace things, sometimes frustrated easily, she also called 911 frequently for a while has the delusional idea that her brother is controlling her TV station when she noticed the blinking box attached to her TV, her symptoms has much improved after starting Abilify 2 mg in June 2020, previously she was treated with Aricept for about 6 months without significant improvement  She has mild gait abnormality due to left knee pain  Laboratory evaluations CMP creatinine of 1 LDL 153  Update May 04, 2019 SS: Laboratory evaluation TSH, B12 was normal, MRI of the brain showed mild to moderate generalized atrophy, mild small vessel disease. She remains on Namenda.   She continues to live alone, her daughter is close by.  She has friends who come by during the day to visit.  So far she has been doing well living alone.  She does not cook.  She has a good appetite.  Her daughter manages her medications.  She sleeps well at night, she has not had any falls.  She does all of her own ADLs, walks with a cane.  She does her  own laundry.  She does not drive a car.  She has been on Aricept, but was taken off in the past.  She is taking Abilify for history of delusions.   Update December 26, 2019 SS: Here today with daughter, [name]. Thinks memory is good for her age. Gets confused on days/time. Some repetitive questioning. During day, has a lot of visitors, does her own ADLs, has someone coming 6 days a week 3-4 hours a day. No falls. Overall, health is good. Going through phases in the morning of feeling bad, nothing turned up with doctor. She doesn't want to be alone, she gets bored. Right now, what they are doing is working.  REVIEW OF SYSTEMS: Out of a complete 14 system review of symptoms, the patient complains only of the following symptoms, and all other reviewed systems are negative.  Memory loss  ALLERGIES: No Known Allergies  HOME MEDICATIONS: Outpatient Medications Prior to Visit  Medication Sig Dispense Refill   alendronate (FOSAMAX) 70 MG tablet Take 70 mg by mouth once a week. Take with a full glass of water on an empty stomach.     ARIPiprazole (ABILIFY) 2 MG tablet Take 2 mg by mouth daily.     levothyroxine (SYNTHROID, LEVOTHROID) 125 MCG tablet Take 125 mcg by mouth daily.       memantine (NAMENDA) 10 MG tablet Take 1 tablet (10 mg total) by mouth 2 (two) times daily. 60 tablet 11   omeprazole (PRILOSEC) 20 MG capsule Take 20 mg by mouth daily.  sertraline (ZOLOFT) 100 MG tablet Take 100 mg by mouth daily. Taking 200 mg     traZODone (DESYREL) 100 MG tablet Take 100 mg by mouth at bedtime.       No facility-administered medications prior to visit.    PAST MEDICAL HISTORY: Past Medical History:  Diagnosis Date   Arthritis    Depression    GERD (gastroesophageal reflux disease)    Hearing loss    Hypercholesterolemia    Knee pain    Memory loss    Thyroid nodule     PAST SURGICAL HISTORY: Past Surgical History:  Procedure Laterality Date   KNEE SURGERY Right     replacement   THYROID SURGERY     nodules - benign    FAMILY HISTORY: Family History  Problem Relation Age of Onset   Stroke Mother    Diabetes Sister    Cancer Sister    Heart disease Brother    Heart attack Brother    Heart attack Father     SOCIAL HISTORY: Social History   Socioeconomic History   Marital status: Widowed    Spouse name: Not on file   Number of children: 2   Years of education: 7th grade   Highest education level: Not on file  Occupational History   Occupation: Retired from [employer] fundraiser  Tobacco Use   Smoking status: Never Smoker   Smokeless tobacco: Never Used  Substance and Sexual Activity   Alcohol use: No   Drug use: No   Sexual activity: Not on file  Other Topics Concern   Not on file  Social History Narrative   Lives at home alone.   Leftt-handed.   Caffeine use:  2 cups per day.   Social Determinants of Health   Financial Resource Strain:    Difficulty of Paying Living Expenses:   Food Insecurity:    Worried About Charity fundraiser in the Last Year:    Arboriculturist in the Last Year:   Transportation Needs:    Film/video editor (Medical):    Lack of Transportation (Non-Medical):   Physical Activity:    Days of Exercise per Week:    Minutes of Exercise per Session:   Stress:    Feeling of Stress :   Social Connections:    Frequency of Communication with Friends and Family:    Frequency of Social Gatherings with Friends and Family:    Attends Religious Services:    Active Member of Clubs or Organizations:    Attends Archivist Meetings:    Marital Status:   Intimate Partner Violence:    Fear of Current or Ex-Partner:    Emotionally Abused:    Physically Abused:    Sexually Abused:       PHYSICAL EXAM  Vitals:   12/26/19 1324  BP: (!) 129/74  Pulse: 78  Weight: 154 lb (69.9 kg)  Height: 5\' 4"  (1.626 m)   Body mass index is 26.43 kg/m.  Generalized: Well  developed, in no acute distress  MMSE - Mini Mental State Exam 12/26/2019 05/04/2019 01/10/2019  Not completed: - (No Data) -  Orientation to time 0 2 4  Orientation to Place 2 3 5   Registration 3 3 3   Attention/ Calculation 0 0 5  Recall 2 0 0  Language- name 2 objects 2 2 2   Language- repeat 0 0 1  Language- repeat-comments - she said ifs ands or buts -  Language- follow 3 step command 3  3 3  Language- read & follow direction 1 1 1   Write a sentence 1 1 1   Copy design 1 1 0  Total score 15 16 25     Neurological examination  Mentation: Alert oriented to time, place, most history is provided by her daughter.  Mild difficulty following commands, speech and language fluent Cranial nerve II-XII: Pupils were equal round reactive to light. Extraocular movements were full, visual field were full on confrontational test. Facial sensation and strength were normal. Head turning and shoulder shrug  were normal and symmetric. Motor: Strength is intact, no significant muscle weakness noted Sensory: Sensory testing is intact to soft touch on all 4 extremities. No evidence of extinction is noted.  Coordination: Cerebellar testing reveals good finger-nose-finger and heel-to-shin bilaterally.  Gait and station: Wide-based, cautious, can walk independently Reflexes: Deep tendon reflexes are symmetric but depressed throughout  DIAGNOSTIC DATA (LABS, IMAGING, TESTING) - I reviewed patient records, labs, notes, testing and imaging myself where available.  Lab Results  Component Value Date   WBC 5.6 12/17/2010   HGB 9.9 (L) 12/17/2010   HCT 29.5 (L) 12/17/2010   MCV 94.2 12/17/2010   PLT 160 12/17/2010      Component Value Date/Time   NA 140 12/17/2010 0641   K 3.7 12/17/2010 0641   CL 105 12/17/2010 0641   CO2 30 12/17/2010 0641   GLUCOSE 113 (H) 12/17/2010 0641   BUN 9 12/17/2010 0641   CREATININE 0.62 12/17/2010 0641   CALCIUM 8.5 12/17/2010 0641   PROT 6.8 12/10/2010 1131   ALBUMIN 3.7  12/10/2010 1131   AST 21 12/10/2010 1131   ALT 16 12/10/2010 1131   ALKPHOS 91 12/10/2010 1131   BILITOT 0.3 12/10/2010 1131   GFRNONAA >60 12/17/2010 0641   GFRAA >60 12/17/2010 0641   No results found for: CHOL, HDL, LDLCALC, LDLDIRECT, TRIG, CHOLHDL No results found for: HGBA1C Lab Results  Component Value Date   VITAMINB12 817 01/10/2019   Lab Results  Component Value Date   TSH 4.080 01/10/2019      ASSESSMENT AND PLAN 86 y.o. year old female  has a past medical history of Arthritis, Depression, GERD (gastroesophageal reflux disease), Hearing loss, Hypercholesterolemia, Knee pain, Memory loss, and Thyroid nodule. here with:  1.  Dementia -Strong family history of dementia -MRI of the brain showed mild to moderate generalized atrophy, mild small vessel disease, pattern is consistent with Alzheimer's disease -B12, TSH were normal -Continue Namenda 10 mg twice a day, was previously on Aricept taken off -MMSE stable 15/30 -Continue to follow with PCP, follow-up in our office on as-needed basis  I spent 20 minutes of face-to-face and non-face-to-face time with patient.  This included previsit chart review, lab review, study review, order entry, electronic health record documentation, patient education.  Butler Denmark, AGNP-C, DNP 12/26/2019, 1:54 PM [facility] [address], [place], [place] [address] [phone]

## 2019-12-29 DIAGNOSIS — I129 Hypertensive chronic kidney disease with stage 1 through stage 4 chronic kidney disease, or unspecified chronic kidney disease: Secondary | ICD-10-CM | POA: Diagnosis not present

## 2019-12-29 DIAGNOSIS — N183 Chronic kidney disease, stage 3 unspecified: Secondary | ICD-10-CM | POA: Diagnosis not present

## 2019-12-29 DIAGNOSIS — E039 Hypothyroidism, unspecified: Secondary | ICD-10-CM | POA: Diagnosis not present

## 2019-12-29 DIAGNOSIS — E7849 Other hyperlipidemia: Secondary | ICD-10-CM | POA: Diagnosis not present

## 2020-01-30 DIAGNOSIS — N183 Chronic kidney disease, stage 3 unspecified: Secondary | ICD-10-CM | POA: Diagnosis not present

## 2020-01-30 DIAGNOSIS — E7849 Other hyperlipidemia: Secondary | ICD-10-CM | POA: Diagnosis not present

## 2020-01-30 DIAGNOSIS — I129 Hypertensive chronic kidney disease with stage 1 through stage 4 chronic kidney disease, or unspecified chronic kidney disease: Secondary | ICD-10-CM | POA: Diagnosis not present

## 2020-01-30 DIAGNOSIS — E039 Hypothyroidism, unspecified: Secondary | ICD-10-CM | POA: Diagnosis not present

## 2020-02-13 DIAGNOSIS — F331 Major depressive disorder, recurrent, moderate: Secondary | ICD-10-CM | POA: Diagnosis not present

## 2020-02-13 DIAGNOSIS — E782 Mixed hyperlipidemia: Secondary | ICD-10-CM | POA: Diagnosis not present

## 2020-02-13 DIAGNOSIS — E039 Hypothyroidism, unspecified: Secondary | ICD-10-CM | POA: Diagnosis not present

## 2020-02-13 DIAGNOSIS — K219 Gastro-esophageal reflux disease without esophagitis: Secondary | ICD-10-CM | POA: Diagnosis not present

## 2020-02-13 DIAGNOSIS — I10 Essential (primary) hypertension: Secondary | ICD-10-CM | POA: Diagnosis not present

## 2020-02-13 DIAGNOSIS — N183 Chronic kidney disease, stage 3 unspecified: Secondary | ICD-10-CM | POA: Diagnosis not present

## 2020-02-15 DIAGNOSIS — G309 Alzheimer's disease, unspecified: Secondary | ICD-10-CM | POA: Diagnosis not present

## 2020-02-15 DIAGNOSIS — F331 Major depressive disorder, recurrent, moderate: Secondary | ICD-10-CM | POA: Diagnosis not present

## 2020-02-15 DIAGNOSIS — E782 Mixed hyperlipidemia: Secondary | ICD-10-CM | POA: Diagnosis not present

## 2020-02-15 DIAGNOSIS — Z23 Encounter for immunization: Secondary | ICD-10-CM | POA: Diagnosis not present

## 2020-02-15 DIAGNOSIS — I10 Essential (primary) hypertension: Secondary | ICD-10-CM | POA: Diagnosis not present

## 2020-02-15 DIAGNOSIS — Z0001 Encounter for general adult medical examination with abnormal findings: Secondary | ICD-10-CM | POA: Diagnosis not present

## 2020-02-15 DIAGNOSIS — Z6825 Body mass index (BMI) 25.0-25.9, adult: Secondary | ICD-10-CM | POA: Diagnosis not present

## 2020-02-15 DIAGNOSIS — M25512 Pain in left shoulder: Secondary | ICD-10-CM | POA: Diagnosis not present

## 2020-02-22 DIAGNOSIS — M545 Low back pain: Secondary | ICD-10-CM | POA: Diagnosis not present

## 2020-02-22 DIAGNOSIS — G309 Alzheimer's disease, unspecified: Secondary | ICD-10-CM | POA: Diagnosis not present

## 2020-02-22 DIAGNOSIS — Z6825 Body mass index (BMI) 25.0-25.9, adult: Secondary | ICD-10-CM | POA: Diagnosis not present

## 2020-02-29 DIAGNOSIS — N183 Chronic kidney disease, stage 3 unspecified: Secondary | ICD-10-CM | POA: Diagnosis not present

## 2020-02-29 DIAGNOSIS — E7849 Other hyperlipidemia: Secondary | ICD-10-CM | POA: Diagnosis not present

## 2020-02-29 DIAGNOSIS — I129 Hypertensive chronic kidney disease with stage 1 through stage 4 chronic kidney disease, or unspecified chronic kidney disease: Secondary | ICD-10-CM | POA: Diagnosis not present

## 2020-02-29 DIAGNOSIS — E039 Hypothyroidism, unspecified: Secondary | ICD-10-CM | POA: Diagnosis not present

## 2020-03-25 DIAGNOSIS — Z6825 Body mass index (BMI) 25.0-25.9, adult: Secondary | ICD-10-CM | POA: Diagnosis not present

## 2020-03-25 DIAGNOSIS — M545 Low back pain, unspecified: Secondary | ICD-10-CM | POA: Diagnosis not present

## 2020-03-30 DIAGNOSIS — N183 Chronic kidney disease, stage 3 unspecified: Secondary | ICD-10-CM | POA: Diagnosis not present

## 2020-03-30 DIAGNOSIS — E039 Hypothyroidism, unspecified: Secondary | ICD-10-CM | POA: Diagnosis not present

## 2020-03-30 DIAGNOSIS — I129 Hypertensive chronic kidney disease with stage 1 through stage 4 chronic kidney disease, or unspecified chronic kidney disease: Secondary | ICD-10-CM | POA: Diagnosis not present

## 2020-03-30 DIAGNOSIS — E7849 Other hyperlipidemia: Secondary | ICD-10-CM | POA: Diagnosis not present

## 2020-04-10 DIAGNOSIS — Z23 Encounter for immunization: Secondary | ICD-10-CM | POA: Diagnosis not present

## 2020-04-23 DIAGNOSIS — Z6825 Body mass index (BMI) 25.0-25.9, adult: Secondary | ICD-10-CM | POA: Diagnosis not present

## 2020-04-23 DIAGNOSIS — S39012A Strain of muscle, fascia and tendon of lower back, initial encounter: Secondary | ICD-10-CM | POA: Diagnosis not present

## 2020-04-23 DIAGNOSIS — H6120 Impacted cerumen, unspecified ear: Secondary | ICD-10-CM | POA: Diagnosis not present

## 2020-04-29 NOTE — Patient Outreach (Signed)
[facility]) Care Management  04/29/2020  Danielle Barnes Jul 16, 1931 [id number]   Patient has [employer] insurance. Following their workflow I have sent this referral to their Care Management group through email [email or web address] for Care Management    [place] Care Management Assistant

## 2020-04-30 DIAGNOSIS — I129 Hypertensive chronic kidney disease with stage 1 through stage 4 chronic kidney disease, or unspecified chronic kidney disease: Secondary | ICD-10-CM | POA: Diagnosis not present

## 2020-04-30 DIAGNOSIS — N183 Chronic kidney disease, stage 3 unspecified: Secondary | ICD-10-CM | POA: Diagnosis not present

## 2020-04-30 DIAGNOSIS — E7849 Other hyperlipidemia: Secondary | ICD-10-CM | POA: Diagnosis not present

## 2020-05-31 DIAGNOSIS — N183 Chronic kidney disease, stage 3 unspecified: Secondary | ICD-10-CM | POA: Diagnosis not present

## 2020-05-31 DIAGNOSIS — E039 Hypothyroidism, unspecified: Secondary | ICD-10-CM | POA: Diagnosis not present

## 2020-05-31 DIAGNOSIS — I129 Hypertensive chronic kidney disease with stage 1 through stage 4 chronic kidney disease, or unspecified chronic kidney disease: Secondary | ICD-10-CM | POA: Diagnosis not present

## 2020-05-31 DIAGNOSIS — E7849 Other hyperlipidemia: Secondary | ICD-10-CM | POA: Diagnosis not present

## 2020-06-09 DIAGNOSIS — G309 Alzheimer's disease, unspecified: Secondary | ICD-10-CM | POA: Diagnosis not present

## 2020-06-09 DIAGNOSIS — F0280 Dementia in other diseases classified elsewhere without behavioral disturbance: Secondary | ICD-10-CM | POA: Diagnosis not present

## 2020-06-09 DIAGNOSIS — E039 Hypothyroidism, unspecified: Secondary | ICD-10-CM | POA: Diagnosis not present

## 2020-06-09 DIAGNOSIS — M545 Low back pain, unspecified: Secondary | ICD-10-CM | POA: Diagnosis not present

## 2020-06-13 DIAGNOSIS — Z6825 Body mass index (BMI) 25.0-25.9, adult: Secondary | ICD-10-CM | POA: Diagnosis not present

## 2020-06-13 DIAGNOSIS — F331 Major depressive disorder, recurrent, moderate: Secondary | ICD-10-CM | POA: Diagnosis not present

## 2020-06-13 DIAGNOSIS — G309 Alzheimer's disease, unspecified: Secondary | ICD-10-CM | POA: Diagnosis not present

## 2020-06-13 DIAGNOSIS — E039 Hypothyroidism, unspecified: Secondary | ICD-10-CM | POA: Diagnosis not present

## 2020-06-13 DIAGNOSIS — K2100 Gastro-esophageal reflux disease with esophagitis, without bleeding: Secondary | ICD-10-CM | POA: Diagnosis not present

## 2020-06-13 DIAGNOSIS — E7849 Other hyperlipidemia: Secondary | ICD-10-CM | POA: Diagnosis not present

## 2020-06-13 DIAGNOSIS — I10 Essential (primary) hypertension: Secondary | ICD-10-CM | POA: Diagnosis not present

## 2020-06-13 DIAGNOSIS — N1831 Chronic kidney disease, stage 3a: Secondary | ICD-10-CM | POA: Diagnosis not present

## 2020-06-20 DIAGNOSIS — R300 Dysuria: Secondary | ICD-10-CM | POA: Diagnosis not present

## 2020-06-24 DIAGNOSIS — M545 Low back pain, unspecified: Secondary | ICD-10-CM | POA: Diagnosis not present

## 2020-06-24 DIAGNOSIS — G309 Alzheimer's disease, unspecified: Secondary | ICD-10-CM | POA: Diagnosis not present

## 2020-06-24 DIAGNOSIS — F0280 Dementia in other diseases classified elsewhere without behavioral disturbance: Secondary | ICD-10-CM | POA: Diagnosis not present

## 2020-06-24 DIAGNOSIS — E039 Hypothyroidism, unspecified: Secondary | ICD-10-CM | POA: Diagnosis not present

## 2020-06-29 DIAGNOSIS — I129 Hypertensive chronic kidney disease with stage 1 through stage 4 chronic kidney disease, or unspecified chronic kidney disease: Secondary | ICD-10-CM | POA: Diagnosis not present

## 2020-06-29 DIAGNOSIS — E7849 Other hyperlipidemia: Secondary | ICD-10-CM | POA: Diagnosis not present

## 2020-06-29 DIAGNOSIS — E039 Hypothyroidism, unspecified: Secondary | ICD-10-CM | POA: Diagnosis not present

## 2020-06-29 DIAGNOSIS — N183 Chronic kidney disease, stage 3 unspecified: Secondary | ICD-10-CM | POA: Diagnosis not present

## 2020-06-30 DIAGNOSIS — E039 Hypothyroidism, unspecified: Secondary | ICD-10-CM | POA: Diagnosis not present

## 2020-06-30 DIAGNOSIS — F0280 Dementia in other diseases classified elsewhere without behavioral disturbance: Secondary | ICD-10-CM | POA: Diagnosis not present

## 2020-06-30 DIAGNOSIS — M545 Low back pain, unspecified: Secondary | ICD-10-CM | POA: Diagnosis not present

## 2020-06-30 DIAGNOSIS — G309 Alzheimer's disease, unspecified: Secondary | ICD-10-CM | POA: Diagnosis not present

## 2020-07-04 DIAGNOSIS — M545 Low back pain, unspecified: Secondary | ICD-10-CM | POA: Diagnosis not present

## 2020-07-04 DIAGNOSIS — G309 Alzheimer's disease, unspecified: Secondary | ICD-10-CM | POA: Diagnosis not present

## 2020-07-04 DIAGNOSIS — F0280 Dementia in other diseases classified elsewhere without behavioral disturbance: Secondary | ICD-10-CM | POA: Diagnosis not present

## 2020-07-04 DIAGNOSIS — E039 Hypothyroidism, unspecified: Secondary | ICD-10-CM | POA: Diagnosis not present

## 2020-07-05 DIAGNOSIS — E039 Hypothyroidism, unspecified: Secondary | ICD-10-CM | POA: Diagnosis not present

## 2020-07-05 DIAGNOSIS — F0280 Dementia in other diseases classified elsewhere without behavioral disturbance: Secondary | ICD-10-CM | POA: Diagnosis not present

## 2020-07-05 DIAGNOSIS — M545 Low back pain, unspecified: Secondary | ICD-10-CM | POA: Diagnosis not present

## 2020-07-05 DIAGNOSIS — G309 Alzheimer's disease, unspecified: Secondary | ICD-10-CM | POA: Diagnosis not present

## 2020-07-17 DIAGNOSIS — M545 Low back pain, unspecified: Secondary | ICD-10-CM | POA: Diagnosis not present

## 2020-07-17 DIAGNOSIS — F0280 Dementia in other diseases classified elsewhere without behavioral disturbance: Secondary | ICD-10-CM | POA: Diagnosis not present

## 2020-07-17 DIAGNOSIS — G309 Alzheimer's disease, unspecified: Secondary | ICD-10-CM | POA: Diagnosis not present

## 2020-07-17 DIAGNOSIS — E039 Hypothyroidism, unspecified: Secondary | ICD-10-CM | POA: Diagnosis not present

## 2020-07-29 DIAGNOSIS — E039 Hypothyroidism, unspecified: Secondary | ICD-10-CM | POA: Diagnosis not present

## 2020-07-29 DIAGNOSIS — N183 Chronic kidney disease, stage 3 unspecified: Secondary | ICD-10-CM | POA: Diagnosis not present

## 2020-07-29 DIAGNOSIS — I129 Hypertensive chronic kidney disease with stage 1 through stage 4 chronic kidney disease, or unspecified chronic kidney disease: Secondary | ICD-10-CM | POA: Diagnosis not present

## 2020-07-29 DIAGNOSIS — E7849 Other hyperlipidemia: Secondary | ICD-10-CM | POA: Diagnosis not present

## 2020-07-30 DIAGNOSIS — F0280 Dementia in other diseases classified elsewhere without behavioral disturbance: Secondary | ICD-10-CM | POA: Diagnosis not present

## 2020-07-30 DIAGNOSIS — E039 Hypothyroidism, unspecified: Secondary | ICD-10-CM | POA: Diagnosis not present

## 2020-07-30 DIAGNOSIS — G309 Alzheimer's disease, unspecified: Secondary | ICD-10-CM | POA: Diagnosis not present

## 2020-07-30 DIAGNOSIS — M545 Low back pain, unspecified: Secondary | ICD-10-CM | POA: Diagnosis not present

## 2020-08-01 DIAGNOSIS — F0280 Dementia in other diseases classified elsewhere without behavioral disturbance: Secondary | ICD-10-CM | POA: Diagnosis not present

## 2020-08-01 DIAGNOSIS — G309 Alzheimer's disease, unspecified: Secondary | ICD-10-CM | POA: Diagnosis not present

## 2020-08-01 DIAGNOSIS — E039 Hypothyroidism, unspecified: Secondary | ICD-10-CM | POA: Diagnosis not present

## 2020-08-01 DIAGNOSIS — M545 Low back pain, unspecified: Secondary | ICD-10-CM | POA: Diagnosis not present

## 2020-08-28 DIAGNOSIS — E7849 Other hyperlipidemia: Secondary | ICD-10-CM | POA: Diagnosis not present

## 2020-08-28 DIAGNOSIS — N183 Chronic kidney disease, stage 3 unspecified: Secondary | ICD-10-CM | POA: Diagnosis not present

## 2020-08-28 DIAGNOSIS — I129 Hypertensive chronic kidney disease with stage 1 through stage 4 chronic kidney disease, or unspecified chronic kidney disease: Secondary | ICD-10-CM | POA: Diagnosis not present

## 2020-08-28 DIAGNOSIS — E039 Hypothyroidism, unspecified: Secondary | ICD-10-CM | POA: Diagnosis not present

## 2020-09-06 DIAGNOSIS — R300 Dysuria: Secondary | ICD-10-CM | POA: Diagnosis not present

## 2020-09-09 DIAGNOSIS — M79642 Pain in left hand: Secondary | ICD-10-CM | POA: Diagnosis not present

## 2020-09-09 DIAGNOSIS — R531 Weakness: Secondary | ICD-10-CM | POA: Diagnosis not present

## 2020-09-09 DIAGNOSIS — M25642 Stiffness of left hand, not elsewhere classified: Secondary | ICD-10-CM | POA: Diagnosis not present

## 2020-09-09 DIAGNOSIS — Z736 Limitation of activities due to disability: Secondary | ICD-10-CM | POA: Diagnosis not present

## 2020-09-16 DIAGNOSIS — M25642 Stiffness of left hand, not elsewhere classified: Secondary | ICD-10-CM | POA: Diagnosis not present

## 2020-09-16 DIAGNOSIS — Z736 Limitation of activities due to disability: Secondary | ICD-10-CM | POA: Diagnosis not present

## 2020-09-16 DIAGNOSIS — R531 Weakness: Secondary | ICD-10-CM | POA: Diagnosis not present

## 2020-09-16 DIAGNOSIS — M79642 Pain in left hand: Secondary | ICD-10-CM | POA: Diagnosis not present

## 2020-09-18 DIAGNOSIS — M79642 Pain in left hand: Secondary | ICD-10-CM | POA: Diagnosis not present

## 2020-09-18 DIAGNOSIS — Z736 Limitation of activities due to disability: Secondary | ICD-10-CM | POA: Diagnosis not present

## 2020-09-18 DIAGNOSIS — R531 Weakness: Secondary | ICD-10-CM | POA: Diagnosis not present

## 2020-09-18 DIAGNOSIS — M25642 Stiffness of left hand, not elsewhere classified: Secondary | ICD-10-CM | POA: Diagnosis not present

## 2020-09-21 DIAGNOSIS — E869 Volume depletion, unspecified: Secondary | ICD-10-CM | POA: Diagnosis not present

## 2020-09-21 DIAGNOSIS — W19XXXA Unspecified fall, initial encounter: Secondary | ICD-10-CM | POA: Diagnosis not present

## 2020-09-21 DIAGNOSIS — B9689 Other specified bacterial agents as the cause of diseases classified elsewhere: Secondary | ICD-10-CM | POA: Diagnosis not present

## 2020-09-21 DIAGNOSIS — R531 Weakness: Secondary | ICD-10-CM | POA: Diagnosis not present

## 2020-09-21 DIAGNOSIS — J209 Acute bronchitis, unspecified: Secondary | ICD-10-CM | POA: Diagnosis not present

## 2020-09-21 DIAGNOSIS — R0902 Hypoxemia: Secondary | ICD-10-CM | POA: Diagnosis not present

## 2020-09-21 DIAGNOSIS — R4182 Altered mental status, unspecified: Secondary | ICD-10-CM | POA: Diagnosis not present

## 2020-09-21 DIAGNOSIS — F0390 Unspecified dementia without behavioral disturbance: Secondary | ICD-10-CM | POA: Diagnosis not present

## 2020-09-21 DIAGNOSIS — R9431 Abnormal electrocardiogram [ECG] [EKG]: Secondary | ICD-10-CM | POA: Diagnosis not present

## 2020-09-21 DIAGNOSIS — R0689 Other abnormalities of breathing: Secondary | ICD-10-CM | POA: Diagnosis not present

## 2020-09-25 DIAGNOSIS — M79642 Pain in left hand: Secondary | ICD-10-CM | POA: Diagnosis not present

## 2020-09-25 DIAGNOSIS — Z736 Limitation of activities due to disability: Secondary | ICD-10-CM | POA: Diagnosis not present

## 2020-09-25 DIAGNOSIS — R531 Weakness: Secondary | ICD-10-CM | POA: Diagnosis not present

## 2020-09-25 DIAGNOSIS — M25642 Stiffness of left hand, not elsewhere classified: Secondary | ICD-10-CM | POA: Diagnosis not present

## 2020-09-28 DIAGNOSIS — I129 Hypertensive chronic kidney disease with stage 1 through stage 4 chronic kidney disease, or unspecified chronic kidney disease: Secondary | ICD-10-CM | POA: Diagnosis not present

## 2020-09-28 DIAGNOSIS — N183 Chronic kidney disease, stage 3 unspecified: Secondary | ICD-10-CM | POA: Diagnosis not present

## 2020-09-28 DIAGNOSIS — E7849 Other hyperlipidemia: Secondary | ICD-10-CM | POA: Diagnosis not present

## 2020-09-28 DIAGNOSIS — E039 Hypothyroidism, unspecified: Secondary | ICD-10-CM | POA: Diagnosis not present

## 2020-09-30 DIAGNOSIS — Z736 Limitation of activities due to disability: Secondary | ICD-10-CM | POA: Diagnosis not present

## 2020-09-30 DIAGNOSIS — M79642 Pain in left hand: Secondary | ICD-10-CM | POA: Diagnosis not present

## 2020-09-30 DIAGNOSIS — M25642 Stiffness of left hand, not elsewhere classified: Secondary | ICD-10-CM | POA: Diagnosis not present

## 2020-09-30 DIAGNOSIS — R531 Weakness: Secondary | ICD-10-CM | POA: Diagnosis not present

## 2020-10-02 DIAGNOSIS — R531 Weakness: Secondary | ICD-10-CM | POA: Diagnosis not present

## 2020-10-02 DIAGNOSIS — M25642 Stiffness of left hand, not elsewhere classified: Secondary | ICD-10-CM | POA: Diagnosis not present

## 2020-10-02 DIAGNOSIS — Z736 Limitation of activities due to disability: Secondary | ICD-10-CM | POA: Diagnosis not present

## 2020-10-02 DIAGNOSIS — M79642 Pain in left hand: Secondary | ICD-10-CM | POA: Diagnosis not present

## 2020-10-10 DIAGNOSIS — I10 Essential (primary) hypertension: Secondary | ICD-10-CM | POA: Diagnosis not present

## 2020-10-10 DIAGNOSIS — E7849 Other hyperlipidemia: Secondary | ICD-10-CM | POA: Diagnosis not present

## 2020-10-10 DIAGNOSIS — E039 Hypothyroidism, unspecified: Secondary | ICD-10-CM | POA: Diagnosis not present

## 2020-10-10 DIAGNOSIS — K2100 Gastro-esophageal reflux disease with esophagitis, without bleeding: Secondary | ICD-10-CM | POA: Diagnosis not present

## 2020-10-10 DIAGNOSIS — Z23 Encounter for immunization: Secondary | ICD-10-CM | POA: Diagnosis not present

## 2020-10-10 DIAGNOSIS — N1831 Chronic kidney disease, stage 3a: Secondary | ICD-10-CM | POA: Diagnosis not present

## 2020-10-10 DIAGNOSIS — F331 Major depressive disorder, recurrent, moderate: Secondary | ICD-10-CM | POA: Diagnosis not present

## 2020-10-10 DIAGNOSIS — G309 Alzheimer's disease, unspecified: Secondary | ICD-10-CM | POA: Diagnosis not present

## 2020-10-28 DIAGNOSIS — E039 Hypothyroidism, unspecified: Secondary | ICD-10-CM | POA: Diagnosis not present

## 2020-10-28 DIAGNOSIS — N183 Chronic kidney disease, stage 3 unspecified: Secondary | ICD-10-CM | POA: Diagnosis not present

## 2020-10-28 DIAGNOSIS — I129 Hypertensive chronic kidney disease with stage 1 through stage 4 chronic kidney disease, or unspecified chronic kidney disease: Secondary | ICD-10-CM | POA: Diagnosis not present

## 2020-10-28 DIAGNOSIS — E7849 Other hyperlipidemia: Secondary | ICD-10-CM | POA: Diagnosis not present

## 2020-11-28 DIAGNOSIS — E039 Hypothyroidism, unspecified: Secondary | ICD-10-CM | POA: Diagnosis not present

## 2020-11-28 DIAGNOSIS — E7849 Other hyperlipidemia: Secondary | ICD-10-CM | POA: Diagnosis not present

## 2020-11-28 DIAGNOSIS — I129 Hypertensive chronic kidney disease with stage 1 through stage 4 chronic kidney disease, or unspecified chronic kidney disease: Secondary | ICD-10-CM | POA: Diagnosis not present

## 2020-11-28 DIAGNOSIS — N183 Chronic kidney disease, stage 3 unspecified: Secondary | ICD-10-CM | POA: Diagnosis not present

## 2020-12-20 ENCOUNTER — Other Ambulatory Visit: Payer: Self-pay | Admitting: Neurology

## 2020-12-29 DIAGNOSIS — E039 Hypothyroidism, unspecified: Secondary | ICD-10-CM | POA: Diagnosis not present

## 2020-12-29 DIAGNOSIS — E7849 Other hyperlipidemia: Secondary | ICD-10-CM | POA: Diagnosis not present

## 2020-12-29 DIAGNOSIS — I129 Hypertensive chronic kidney disease with stage 1 through stage 4 chronic kidney disease, or unspecified chronic kidney disease: Secondary | ICD-10-CM | POA: Diagnosis not present

## 2020-12-29 DIAGNOSIS — N183 Chronic kidney disease, stage 3 unspecified: Secondary | ICD-10-CM | POA: Diagnosis not present

## 2021-01-25 DIAGNOSIS — R300 Dysuria: Secondary | ICD-10-CM | POA: Diagnosis not present

## 2021-01-29 DIAGNOSIS — N183 Chronic kidney disease, stage 3 unspecified: Secondary | ICD-10-CM | POA: Diagnosis not present

## 2021-01-29 DIAGNOSIS — E7849 Other hyperlipidemia: Secondary | ICD-10-CM | POA: Diagnosis not present

## 2021-01-29 DIAGNOSIS — E039 Hypothyroidism, unspecified: Secondary | ICD-10-CM | POA: Diagnosis not present

## 2021-01-29 DIAGNOSIS — I129 Hypertensive chronic kidney disease with stage 1 through stage 4 chronic kidney disease, or unspecified chronic kidney disease: Secondary | ICD-10-CM | POA: Diagnosis not present

## 2021-02-17 DIAGNOSIS — I10 Essential (primary) hypertension: Secondary | ICD-10-CM | POA: Diagnosis not present

## 2021-02-17 DIAGNOSIS — E039 Hypothyroidism, unspecified: Secondary | ICD-10-CM | POA: Diagnosis not present

## 2021-02-17 DIAGNOSIS — Z0001 Encounter for general adult medical examination with abnormal findings: Secondary | ICD-10-CM | POA: Diagnosis not present

## 2021-02-17 DIAGNOSIS — N183 Chronic kidney disease, stage 3 unspecified: Secondary | ICD-10-CM | POA: Diagnosis not present

## 2021-02-17 DIAGNOSIS — I493 Ventricular premature depolarization: Secondary | ICD-10-CM | POA: Diagnosis not present

## 2021-02-17 DIAGNOSIS — G309 Alzheimer's disease, unspecified: Secondary | ICD-10-CM | POA: Diagnosis not present

## 2021-02-17 DIAGNOSIS — R531 Weakness: Secondary | ICD-10-CM | POA: Diagnosis not present

## 2021-02-17 DIAGNOSIS — E782 Mixed hyperlipidemia: Secondary | ICD-10-CM | POA: Diagnosis not present

## 2021-02-17 DIAGNOSIS — E7849 Other hyperlipidemia: Secondary | ICD-10-CM | POA: Diagnosis not present

## 2021-02-17 DIAGNOSIS — K219 Gastro-esophageal reflux disease without esophagitis: Secondary | ICD-10-CM | POA: Diagnosis not present

## 2021-02-19 ENCOUNTER — Other Ambulatory Visit: Payer: Self-pay | Admitting: Neurology

## 2021-02-20 DIAGNOSIS — E7849 Other hyperlipidemia: Secondary | ICD-10-CM | POA: Diagnosis not present

## 2021-02-20 DIAGNOSIS — I10 Essential (primary) hypertension: Secondary | ICD-10-CM | POA: Diagnosis not present

## 2021-02-20 DIAGNOSIS — Z23 Encounter for immunization: Secondary | ICD-10-CM | POA: Diagnosis not present

## 2021-02-20 DIAGNOSIS — E039 Hypothyroidism, unspecified: Secondary | ICD-10-CM | POA: Diagnosis not present

## 2021-02-20 DIAGNOSIS — F331 Major depressive disorder, recurrent, moderate: Secondary | ICD-10-CM | POA: Diagnosis not present

## 2021-02-20 DIAGNOSIS — N1831 Chronic kidney disease, stage 3a: Secondary | ICD-10-CM | POA: Diagnosis not present

## 2021-02-20 DIAGNOSIS — G309 Alzheimer's disease, unspecified: Secondary | ICD-10-CM | POA: Diagnosis not present

## 2021-02-20 DIAGNOSIS — Z0001 Encounter for general adult medical examination with abnormal findings: Secondary | ICD-10-CM | POA: Diagnosis not present

## 2021-02-28 DIAGNOSIS — I129 Hypertensive chronic kidney disease with stage 1 through stage 4 chronic kidney disease, or unspecified chronic kidney disease: Secondary | ICD-10-CM | POA: Diagnosis not present

## 2021-02-28 DIAGNOSIS — N183 Chronic kidney disease, stage 3 unspecified: Secondary | ICD-10-CM | POA: Diagnosis not present

## 2021-02-28 DIAGNOSIS — E039 Hypothyroidism, unspecified: Secondary | ICD-10-CM | POA: Diagnosis not present

## 2021-02-28 DIAGNOSIS — E7849 Other hyperlipidemia: Secondary | ICD-10-CM | POA: Diagnosis not present

## 2021-03-31 DIAGNOSIS — E039 Hypothyroidism, unspecified: Secondary | ICD-10-CM | POA: Diagnosis not present

## 2021-03-31 DIAGNOSIS — N183 Chronic kidney disease, stage 3 unspecified: Secondary | ICD-10-CM | POA: Diagnosis not present

## 2021-03-31 DIAGNOSIS — I129 Hypertensive chronic kidney disease with stage 1 through stage 4 chronic kidney disease, or unspecified chronic kidney disease: Secondary | ICD-10-CM | POA: Diagnosis not present

## 2021-03-31 DIAGNOSIS — E7849 Other hyperlipidemia: Secondary | ICD-10-CM | POA: Diagnosis not present

## 2021-04-19 DIAGNOSIS — R300 Dysuria: Secondary | ICD-10-CM | POA: Diagnosis not present

## 2021-04-30 DIAGNOSIS — N183 Chronic kidney disease, stage 3 unspecified: Secondary | ICD-10-CM | POA: Diagnosis not present

## 2021-04-30 DIAGNOSIS — E039 Hypothyroidism, unspecified: Secondary | ICD-10-CM | POA: Diagnosis not present

## 2021-04-30 DIAGNOSIS — E7849 Other hyperlipidemia: Secondary | ICD-10-CM | POA: Diagnosis not present

## 2021-04-30 DIAGNOSIS — I129 Hypertensive chronic kidney disease with stage 1 through stage 4 chronic kidney disease, or unspecified chronic kidney disease: Secondary | ICD-10-CM | POA: Diagnosis not present

## 2021-05-03 NOTE — Progress Notes (Signed)
Chart reviewed, agree above plan ?

## 2021-05-30 DIAGNOSIS — N183 Chronic kidney disease, stage 3 unspecified: Secondary | ICD-10-CM | POA: Diagnosis not present

## 2021-05-30 DIAGNOSIS — I129 Hypertensive chronic kidney disease with stage 1 through stage 4 chronic kidney disease, or unspecified chronic kidney disease: Secondary | ICD-10-CM | POA: Diagnosis not present

## 2021-05-30 DIAGNOSIS — E7849 Other hyperlipidemia: Secondary | ICD-10-CM | POA: Diagnosis not present

## 2021-05-30 DIAGNOSIS — E039 Hypothyroidism, unspecified: Secondary | ICD-10-CM | POA: Diagnosis not present

## 2021-06-29 DIAGNOSIS — N183 Chronic kidney disease, stage 3 unspecified: Secondary | ICD-10-CM | POA: Diagnosis not present

## 2021-06-29 DIAGNOSIS — E039 Hypothyroidism, unspecified: Secondary | ICD-10-CM | POA: Diagnosis not present

## 2021-06-29 DIAGNOSIS — I129 Hypertensive chronic kidney disease with stage 1 through stage 4 chronic kidney disease, or unspecified chronic kidney disease: Secondary | ICD-10-CM | POA: Diagnosis not present

## 2021-06-29 DIAGNOSIS — E7849 Other hyperlipidemia: Secondary | ICD-10-CM | POA: Diagnosis not present

## 2021-07-10 DIAGNOSIS — Z111 Encounter for screening for respiratory tuberculosis: Secondary | ICD-10-CM | POA: Diagnosis not present

## 2021-08-14 DIAGNOSIS — R300 Dysuria: Secondary | ICD-10-CM | POA: Diagnosis not present

## 2021-08-19 DIAGNOSIS — I10 Essential (primary) hypertension: Secondary | ICD-10-CM | POA: Diagnosis not present

## 2021-08-19 DIAGNOSIS — R4582 Worries: Secondary | ICD-10-CM | POA: Diagnosis not present

## 2021-08-19 DIAGNOSIS — G309 Alzheimer's disease, unspecified: Secondary | ICD-10-CM | POA: Diagnosis not present

## 2021-08-19 DIAGNOSIS — E7849 Other hyperlipidemia: Secondary | ICD-10-CM | POA: Diagnosis not present

## 2021-08-19 DIAGNOSIS — K2100 Gastro-esophageal reflux disease with esophagitis, without bleeding: Secondary | ICD-10-CM | POA: Diagnosis not present

## 2021-08-19 DIAGNOSIS — E039 Hypothyroidism, unspecified: Secondary | ICD-10-CM | POA: Diagnosis not present

## 2021-09-13 DIAGNOSIS — S0003XA Contusion of scalp, initial encounter: Secondary | ICD-10-CM | POA: Diagnosis not present

## 2021-09-13 DIAGNOSIS — W19XXXA Unspecified fall, initial encounter: Secondary | ICD-10-CM | POA: Diagnosis not present

## 2021-09-13 DIAGNOSIS — I739 Peripheral vascular disease, unspecified: Secondary | ICD-10-CM | POA: Diagnosis not present

## 2021-09-13 DIAGNOSIS — S299XXA Unspecified injury of thorax, initial encounter: Secondary | ICD-10-CM | POA: Diagnosis not present

## 2021-09-13 DIAGNOSIS — W06XXXA Fall from bed, initial encounter: Secondary | ICD-10-CM | POA: Diagnosis not present

## 2021-09-13 DIAGNOSIS — S0990XA Unspecified injury of head, initial encounter: Secondary | ICD-10-CM | POA: Diagnosis not present

## 2021-09-13 DIAGNOSIS — R55 Syncope and collapse: Secondary | ICD-10-CM | POA: Diagnosis not present

## 2021-09-13 DIAGNOSIS — I700 Atherosclerosis of aorta: Secondary | ICD-10-CM | POA: Diagnosis not present

## 2021-09-13 DIAGNOSIS — Z043 Encounter for examination and observation following other accident: Secondary | ICD-10-CM | POA: Diagnosis not present

## 2021-09-13 DIAGNOSIS — G4489 Other headache syndrome: Secondary | ICD-10-CM | POA: Diagnosis not present

## 2021-09-13 DIAGNOSIS — R918 Other nonspecific abnormal finding of lung field: Secondary | ICD-10-CM | POA: Diagnosis not present

## 2021-09-23 DIAGNOSIS — W19XXXA Unspecified fall, initial encounter: Secondary | ICD-10-CM | POA: Diagnosis not present

## 2021-09-23 DIAGNOSIS — S01311A Laceration without foreign body of right ear, initial encounter: Secondary | ICD-10-CM | POA: Diagnosis not present

## 2021-09-23 DIAGNOSIS — R410 Disorientation, unspecified: Secondary | ICD-10-CM | POA: Diagnosis not present

## 2021-09-23 DIAGNOSIS — R52 Pain, unspecified: Secondary | ICD-10-CM | POA: Diagnosis not present

## 2021-10-01 DIAGNOSIS — S01311A Laceration without foreign body of right ear, initial encounter: Secondary | ICD-10-CM | POA: Diagnosis not present

## 2021-10-02 DIAGNOSIS — N189 Chronic kidney disease, unspecified: Secondary | ICD-10-CM | POA: Diagnosis not present

## 2021-10-02 DIAGNOSIS — K219 Gastro-esophageal reflux disease without esophagitis: Secondary | ICD-10-CM | POA: Diagnosis not present

## 2021-10-02 DIAGNOSIS — E039 Hypothyroidism, unspecified: Secondary | ICD-10-CM | POA: Diagnosis not present

## 2021-10-02 DIAGNOSIS — E785 Hyperlipidemia, unspecified: Secondary | ICD-10-CM | POA: Diagnosis not present

## 2021-10-02 DIAGNOSIS — G4700 Insomnia, unspecified: Secondary | ICD-10-CM | POA: Diagnosis not present

## 2021-10-02 DIAGNOSIS — M810 Age-related osteoporosis without current pathological fracture: Secondary | ICD-10-CM | POA: Diagnosis not present

## 2021-10-11 DIAGNOSIS — E039 Hypothyroidism, unspecified: Secondary | ICD-10-CM | POA: Diagnosis not present

## 2021-10-11 DIAGNOSIS — N189 Chronic kidney disease, unspecified: Secondary | ICD-10-CM | POA: Diagnosis not present

## 2021-10-11 DIAGNOSIS — M810 Age-related osteoporosis without current pathological fracture: Secondary | ICD-10-CM | POA: Diagnosis not present

## 2021-10-11 DIAGNOSIS — E785 Hyperlipidemia, unspecified: Secondary | ICD-10-CM | POA: Diagnosis not present

## 2021-10-23 DIAGNOSIS — R300 Dysuria: Secondary | ICD-10-CM | POA: Diagnosis not present

## 2021-10-23 DIAGNOSIS — N390 Urinary tract infection, site not specified: Secondary | ICD-10-CM | POA: Diagnosis not present

## 2021-10-27 DIAGNOSIS — Z79899 Other long term (current) drug therapy: Secondary | ICD-10-CM | POA: Diagnosis not present

## 2021-10-27 DIAGNOSIS — N390 Urinary tract infection, site not specified: Secondary | ICD-10-CM | POA: Diagnosis not present

## 2021-10-28 DIAGNOSIS — N189 Chronic kidney disease, unspecified: Secondary | ICD-10-CM | POA: Diagnosis not present

## 2021-10-28 DIAGNOSIS — N390 Urinary tract infection, site not specified: Secondary | ICD-10-CM | POA: Diagnosis not present

## 2021-10-28 DIAGNOSIS — E785 Hyperlipidemia, unspecified: Secondary | ICD-10-CM | POA: Diagnosis not present

## 2021-10-28 DIAGNOSIS — K219 Gastro-esophageal reflux disease without esophagitis: Secondary | ICD-10-CM | POA: Diagnosis not present

## 2021-10-28 DIAGNOSIS — G4700 Insomnia, unspecified: Secondary | ICD-10-CM | POA: Diagnosis not present

## 2021-10-28 DIAGNOSIS — I10 Essential (primary) hypertension: Secondary | ICD-10-CM | POA: Diagnosis not present

## 2021-10-28 DIAGNOSIS — E039 Hypothyroidism, unspecified: Secondary | ICD-10-CM | POA: Diagnosis not present

## 2021-11-04 DIAGNOSIS — S61419A Laceration without foreign body of unspecified hand, initial encounter: Secondary | ICD-10-CM | POA: Diagnosis not present

## 2021-11-04 DIAGNOSIS — R296 Repeated falls: Secondary | ICD-10-CM | POA: Diagnosis not present

## 2021-11-04 DIAGNOSIS — T148XXA Other injury of unspecified body region, initial encounter: Secondary | ICD-10-CM | POA: Diagnosis not present

## 2021-11-08 DIAGNOSIS — Z6823 Body mass index (BMI) 23.0-23.9, adult: Secondary | ICD-10-CM | POA: Diagnosis not present

## 2021-11-08 DIAGNOSIS — M6281 Muscle weakness (generalized): Secondary | ICD-10-CM | POA: Diagnosis not present

## 2021-11-08 DIAGNOSIS — E038 Other specified hypothyroidism: Secondary | ICD-10-CM | POA: Diagnosis not present

## 2021-11-08 DIAGNOSIS — S7222XA Displaced subtrochanteric fracture of left femur, initial encounter for closed fracture: Secondary | ICD-10-CM | POA: Diagnosis not present

## 2021-11-08 DIAGNOSIS — M84451A Pathological fracture, right femur, initial encounter for fracture: Secondary | ICD-10-CM | POA: Diagnosis not present

## 2021-11-08 DIAGNOSIS — S7292XD Unspecified fracture of left femur, subsequent encounter for closed fracture with routine healing: Secondary | ICD-10-CM | POA: Diagnosis not present

## 2021-11-08 DIAGNOSIS — S72002A Fracture of unspecified part of neck of left femur, initial encounter for closed fracture: Secondary | ICD-10-CM | POA: Diagnosis not present

## 2021-11-08 DIAGNOSIS — I10 Essential (primary) hypertension: Secondary | ICD-10-CM | POA: Diagnosis not present

## 2021-11-08 DIAGNOSIS — S72142A Displaced intertrochanteric fracture of left femur, initial encounter for closed fracture: Secondary | ICD-10-CM | POA: Diagnosis not present

## 2021-11-08 DIAGNOSIS — R262 Difficulty in walking, not elsewhere classified: Secondary | ICD-10-CM | POA: Diagnosis not present

## 2021-11-08 DIAGNOSIS — Z043 Encounter for examination and observation following other accident: Secondary | ICD-10-CM | POA: Diagnosis not present

## 2021-11-08 DIAGNOSIS — R1311 Dysphagia, oral phase: Secondary | ICD-10-CM | POA: Diagnosis not present

## 2021-11-08 DIAGNOSIS — M810 Age-related osteoporosis without current pathological fracture: Secondary | ICD-10-CM | POA: Diagnosis not present

## 2021-11-08 DIAGNOSIS — D696 Thrombocytopenia, unspecified: Secondary | ICD-10-CM | POA: Diagnosis not present

## 2021-11-08 DIAGNOSIS — I700 Atherosclerosis of aorta: Secondary | ICD-10-CM | POA: Diagnosis not present

## 2021-11-08 DIAGNOSIS — E039 Hypothyroidism, unspecified: Secondary | ICD-10-CM | POA: Diagnosis not present

## 2021-11-08 DIAGNOSIS — Z66 Do not resuscitate: Secondary | ICD-10-CM | POA: Diagnosis not present

## 2021-11-08 DIAGNOSIS — W19XXXA Unspecified fall, initial encounter: Secondary | ICD-10-CM | POA: Diagnosis not present

## 2021-11-08 DIAGNOSIS — Z20822 Contact with and (suspected) exposure to covid-19: Secondary | ICD-10-CM | POA: Diagnosis not present

## 2021-11-08 DIAGNOSIS — R279 Unspecified lack of coordination: Secondary | ICD-10-CM | POA: Diagnosis not present

## 2021-11-08 DIAGNOSIS — W19XXXD Unspecified fall, subsequent encounter: Secondary | ICD-10-CM | POA: Diagnosis not present

## 2021-11-08 DIAGNOSIS — M47816 Spondylosis without myelopathy or radiculopathy, lumbar region: Secondary | ICD-10-CM | POA: Diagnosis not present

## 2021-11-08 DIAGNOSIS — Z4789 Encounter for other orthopedic aftercare: Secondary | ICD-10-CM | POA: Diagnosis not present

## 2021-11-08 DIAGNOSIS — R5381 Other malaise: Secondary | ICD-10-CM | POA: Diagnosis not present

## 2021-11-08 DIAGNOSIS — E063 Autoimmune thyroiditis: Secondary | ICD-10-CM | POA: Diagnosis not present

## 2021-11-08 DIAGNOSIS — M47814 Spondylosis without myelopathy or radiculopathy, thoracic region: Secondary | ICD-10-CM | POA: Diagnosis not present

## 2021-11-08 DIAGNOSIS — Z7401 Bed confinement status: Secondary | ICD-10-CM | POA: Diagnosis not present

## 2021-11-08 DIAGNOSIS — Z79899 Other long term (current) drug therapy: Secondary | ICD-10-CM | POA: Diagnosis not present

## 2021-11-08 DIAGNOSIS — R918 Other nonspecific abnormal finding of lung field: Secondary | ICD-10-CM | POA: Diagnosis not present

## 2021-11-08 DIAGNOSIS — E876 Hypokalemia: Secondary | ICD-10-CM | POA: Diagnosis not present

## 2021-11-08 DIAGNOSIS — I7090 Unspecified atherosclerosis: Secondary | ICD-10-CM | POA: Diagnosis not present

## 2021-11-08 DIAGNOSIS — T148XXA Other injury of unspecified body region, initial encounter: Secondary | ICD-10-CM | POA: Diagnosis not present

## 2021-11-08 DIAGNOSIS — D62 Acute posthemorrhagic anemia: Secondary | ICD-10-CM | POA: Diagnosis not present

## 2021-11-08 DIAGNOSIS — E440 Moderate protein-calorie malnutrition: Secondary | ICD-10-CM | POA: Diagnosis not present

## 2021-11-08 DIAGNOSIS — K5730 Diverticulosis of large intestine without perforation or abscess without bleeding: Secondary | ICD-10-CM | POA: Diagnosis not present

## 2021-11-08 DIAGNOSIS — G309 Alzheimer's disease, unspecified: Secondary | ICD-10-CM | POA: Diagnosis not present

## 2021-11-08 DIAGNOSIS — I959 Hypotension, unspecified: Secondary | ICD-10-CM | POA: Diagnosis not present

## 2021-11-08 DIAGNOSIS — S0990XA Unspecified injury of head, initial encounter: Secondary | ICD-10-CM | POA: Diagnosis not present

## 2021-11-08 DIAGNOSIS — R296 Repeated falls: Secondary | ICD-10-CM | POA: Diagnosis not present

## 2021-11-19 DIAGNOSIS — S7292XD Unspecified fracture of left femur, subsequent encounter for closed fracture with routine healing: Secondary | ICD-10-CM | POA: Diagnosis not present

## 2021-11-19 DIAGNOSIS — G309 Alzheimer's disease, unspecified: Secondary | ICD-10-CM | POA: Diagnosis not present

## 2021-11-19 DIAGNOSIS — I7090 Unspecified atherosclerosis: Secondary | ICD-10-CM | POA: Diagnosis not present

## 2021-11-19 DIAGNOSIS — M25552 Pain in left hip: Secondary | ICD-10-CM | POA: Diagnosis not present

## 2021-11-19 DIAGNOSIS — E440 Moderate protein-calorie malnutrition: Secondary | ICD-10-CM | POA: Diagnosis not present

## 2021-11-19 DIAGNOSIS — S72002A Fracture of unspecified part of neck of left femur, initial encounter for closed fracture: Secondary | ICD-10-CM | POA: Diagnosis not present

## 2021-11-19 DIAGNOSIS — E063 Autoimmune thyroiditis: Secondary | ICD-10-CM | POA: Diagnosis not present

## 2021-11-19 DIAGNOSIS — R5381 Other malaise: Secondary | ICD-10-CM | POA: Diagnosis not present

## 2021-11-19 DIAGNOSIS — E038 Other specified hypothyroidism: Secondary | ICD-10-CM | POA: Diagnosis not present

## 2021-11-19 DIAGNOSIS — E559 Vitamin D deficiency, unspecified: Secondary | ICD-10-CM | POA: Diagnosis not present

## 2021-11-19 DIAGNOSIS — I10 Essential (primary) hypertension: Secondary | ICD-10-CM | POA: Diagnosis not present

## 2021-11-19 DIAGNOSIS — M810 Age-related osteoporosis without current pathological fracture: Secondary | ICD-10-CM | POA: Diagnosis not present

## 2021-11-19 DIAGNOSIS — R279 Unspecified lack of coordination: Secondary | ICD-10-CM | POA: Diagnosis not present

## 2021-11-19 DIAGNOSIS — E785 Hyperlipidemia, unspecified: Secondary | ICD-10-CM | POA: Diagnosis not present

## 2021-11-19 DIAGNOSIS — Z7401 Bed confinement status: Secondary | ICD-10-CM | POA: Diagnosis not present

## 2021-11-19 DIAGNOSIS — N189 Chronic kidney disease, unspecified: Secondary | ICD-10-CM | POA: Diagnosis not present

## 2021-11-19 DIAGNOSIS — E039 Hypothyroidism, unspecified: Secondary | ICD-10-CM | POA: Diagnosis not present

## 2021-11-19 DIAGNOSIS — I959 Hypotension, unspecified: Secondary | ICD-10-CM | POA: Diagnosis not present

## 2021-11-19 DIAGNOSIS — M6281 Muscle weakness (generalized): Secondary | ICD-10-CM | POA: Diagnosis not present

## 2021-11-19 DIAGNOSIS — W19XXXD Unspecified fall, subsequent encounter: Secondary | ICD-10-CM | POA: Diagnosis not present

## 2021-11-19 DIAGNOSIS — R262 Difficulty in walking, not elsewhere classified: Secondary | ICD-10-CM | POA: Diagnosis not present

## 2021-11-19 DIAGNOSIS — R1311 Dysphagia, oral phase: Secondary | ICD-10-CM | POA: Diagnosis not present

## 2021-11-21 DIAGNOSIS — G309 Alzheimer's disease, unspecified: Secondary | ICD-10-CM | POA: Diagnosis not present

## 2021-11-21 DIAGNOSIS — S72002A Fracture of unspecified part of neck of left femur, initial encounter for closed fracture: Secondary | ICD-10-CM | POA: Diagnosis not present

## 2021-11-21 DIAGNOSIS — R5381 Other malaise: Secondary | ICD-10-CM | POA: Diagnosis not present

## 2021-11-25 DIAGNOSIS — S72002A Fracture of unspecified part of neck of left femur, initial encounter for closed fracture: Secondary | ICD-10-CM | POA: Diagnosis not present

## 2021-11-25 DIAGNOSIS — R5381 Other malaise: Secondary | ICD-10-CM | POA: Diagnosis not present

## 2021-11-26 DIAGNOSIS — N189 Chronic kidney disease, unspecified: Secondary | ICD-10-CM | POA: Diagnosis not present

## 2021-11-26 DIAGNOSIS — M810 Age-related osteoporosis without current pathological fracture: Secondary | ICD-10-CM | POA: Diagnosis not present

## 2021-11-26 DIAGNOSIS — E785 Hyperlipidemia, unspecified: Secondary | ICD-10-CM | POA: Diagnosis not present

## 2021-11-26 DIAGNOSIS — I10 Essential (primary) hypertension: Secondary | ICD-10-CM | POA: Diagnosis not present

## 2021-11-26 DIAGNOSIS — E039 Hypothyroidism, unspecified: Secondary | ICD-10-CM | POA: Diagnosis not present

## 2021-12-03 DIAGNOSIS — M25552 Pain in left hip: Secondary | ICD-10-CM | POA: Diagnosis not present

## 2021-12-09 DIAGNOSIS — E039 Hypothyroidism, unspecified: Secondary | ICD-10-CM | POA: Diagnosis not present

## 2021-12-16 ENCOUNTER — Non-Acute Institutional Stay: Payer: PPO | Admitting: Family Medicine

## 2021-12-16 ENCOUNTER — Encounter: Payer: Self-pay | Admitting: Family Medicine

## 2021-12-16 VITALS — BP 110/60 | HR 66 | Resp 16

## 2021-12-16 DIAGNOSIS — Z9889 Other specified postprocedural states: Secondary | ICD-10-CM

## 2021-12-16 DIAGNOSIS — Z8781 Personal history of (healed) traumatic fracture: Secondary | ICD-10-CM | POA: Diagnosis not present

## 2021-12-16 NOTE — Progress Notes (Signed)
[facility] Palliative Care Consult Note Telephone: [phone]  Fax: [phone]   Date of encounter: 12/16/21 12:14 PM PATIENT NAME: Danielle Barnes [address] [place] [address]   [phone] (home)  DOB: 11-May-1932 MRN: [id number] PRIMARY CARE PROVIDER:    Caryl Bis, MD,  [address] [place] [address] [phone]  REFERRING PROVIDER:   Caryl Bis, MD [address] [place],  [place] [address] [phone]  RESPONSIBLE PARTY:    Contact Information     Name Relation Home Work [place] Daughter [phone]         I met face to face with patient in the skilled facility. Palliative Care was asked to follow this patient by consultation request of  Caryl Bis, MD to address advance care planning and complex medical decision making. This is the initial visit.          ASSESSMENT, SYMPTOM MANAGEMENT AND PLAN / RECOMMENDATIONS:   S/P ORIF right hip fracture Normal healing, only able to stand/pivot.   Unable to retain information taught from session to session to be able to successfully ambulate, now bedbound.    Protein Calorie Malnutrition Weight loss of 23 lbs 8 ounces in 2 months with continued poor intake Albumin low at 2.8 as of 11/11/21 with evidence of muscle wasting and sarcopenia in all 4 limbs. Recommend supplementing with protein supplement shake TID, giving meds with food source to increase caloric intake.    Dementia FAST 7 score 6e, is bedbound due to inability to rehab post ORIF right hip fracture  4.  Palliative Care Encounter Need to follow up with pt's daughter for goals of care and interest in transition to Hospice level of care. Possible hospice referral pending after discussion with family and PCP.   Follow up Palliative Care Visit: Palliative care will continue to follow for complex medical decision making, advance care planning, and clarification of goals. Return 4 weeks or  prn.    This visit was coded based on medical decision making (MDM).  PPS: 30%  HOSPICE ELIGIBILITY/DIAGNOSIS: TBD  Chief Complaint:  [facility] received a referral to follow up with patient for chronic disease management in setting of dementia with recent ORIF of hip fracture in mid June 2023.  HISTORY OF PRESENT ILLNESS:  Danielle Barnes is a 88 y.o. year old female with dementia, PAC, HLD, arthritis  and hx of right TKR who experienced a fall with left femur fracture on 11/08/21.  She underwent ORIF at [facility] and was sent to [facility] for rehab.  Staff says her only skin wound is her left hip incision.  She is total care, incontinent of bowel and bladder.  She can stand/pivot but has not been able to successfully return to ambulation. Staff states she is not eating well.  She states she is just not feeling well today.  Has no specific complaints.  Denies CP, SOB, nausea, falls since hip fracture. She was seen by Dr Judithann Sauger of [facility] on 12/03/21 for routine post op visit and he noted that she was healing well, could get incision wet, had no evidence of infection or dehiscence with staple removal and was not requiring pain meds.  Foot was warm, well perfused and had brisk cap refill. He stated she could be activity as tolerated and he wanted to see her back in 3 weeks.  History obtained from review of  EMR, discussion with facility staff and/or Ms. Weyman.   11/08/21 TSH low at 0.314 CBC remarkable for low RBC 3.46, HGB 11.0, HCT 32.2% and PLT 120.  Normal PT/INR and PTT. CMP remarkable for low K+ 3.0, eGFR 54, Albumin 3.3 with elevated total bili 1.3, AST 42, BUN 36, CO2 34.0 and normal Cr 1.01.  CT head and cervical spine wo contrast 11/08/21: Impression  1. No acute intracranial pathology. Small-vessel white matter  disease.  2. No fracture or static subluxation of the cervical spine.  3. Moderate to severe  multilevel cervical disc degenerative disease.   CT pelvis wo contrast 11/08/21: Impression  1. Acute fracture of the proximal left femur.  2. Sigmoid diverticulosis.   Aortic Atherosclerosis (ICD10-I70.0).    CXR 11/09/21: Impression  Chronic appearing increased lung markings without acute or active  cardiopulmonary disease.   11/10/21 xray left hip with pelvis 2 or 3 views: Impression  Internal fixation.  No complicating feature.   11/10/21 Magnesium normal at 2.3.  UA remarkable for small leuk esterase, positive nitrites, 7 WBCs per HPF, few yeast and large bacteria.  Urine cx positive for >100,000 E coli with multi resistance to Bactrim, Cipro, Cephalosporins and Ampicillin  11/11/21 CBC remarkable for low RBC 3.39, HGB 10.4, HCT 32.8%. CMP remarkable for elevated BUN 24.  Cr normal at 0.79 and eGFR at 72.  Albumin low at 2.8.  I reviewed available labs, medications, imaging, studies and related documents from the EMR.  Records reviewed and summarized above.   ROS General: NAD ENMT: denies dysphagia Cardiovascular: denies chest pain, denies DOE Pulmonary: denies cough, denies increased SOB Abdomen: endorses good appetite but staff state she is eating poorly, denies constipation, staff endorses incontinence of bowel GU: denies dysuria, endorses incontinence of urine MSK:  c/o increased generalized weakness, no falls reported Neurological: denies pain, denies insomnia Psych: Endorses depressed mood Heme/lymph/immuno: denies bruises, abnormal bleeding  Physical Exam: Current and past weights: 146 lbs on 09/21/20, 122 lbs 8 ounces as of 11/09/21 Constitutional: NAD General: frail appearing, thin ENMT: hard of hearing CV: S1S2, RRR with occasional premature beat, no LE edema Pulmonary: CTAB, no increased work of breathing, no cough, room air Abdomen: normo-active BS + 4 quadrants, soft and non tender, no ascites GU: deferred MSK: noted sarcopenia with loss of muscle mass and  subcutaneous fat in limbs, moves all extremities Neuro:  pleasantly confused Psych: non-anxious affect, A and O x1   CURRENT PROBLEM LIST:  Patient Active Problem List   Diagnosis Date Noted   Dementia ([place]) 12/26/2019   Memory loss 05/04/2019   Status post total right knee replacement using cement 12/24/2015   PAC (premature atrial contraction) 12/12/2010   Pre-op evaluation 12/12/2010   PAST MEDICAL HISTORY:  Active Ambulatory Problems    Diagnosis Date Noted   PAC (premature atrial contraction) 12/12/2010   Pre-op evaluation 12/12/2010   Status post total right knee replacement using cement 12/24/2015   Memory loss 05/04/2019   Dementia ([place]) 12/26/2019   Resolved Ambulatory Problems    Diagnosis Date Noted   No Resolved Ambulatory Problems   Past Medical History:  Diagnosis Date   Arthritis    Depression    GERD (gastroesophageal reflux disease)    Hearing loss    Hypercholesterolemia    Knee pain    Thyroid nodule    SOCIAL HX:  Social History   Tobacco Use   Smoking status: Never   Smokeless tobacco: Never  Substance Use Topics  Alcohol use: No   FAMILY HX:  Family History  Problem Relation Age of Onset   Stroke Mother    Diabetes Sister    Cancer Sister    Heart disease Brother    Heart attack Brother    Heart attack Father        Preferred Pharmacy: ALLERGIES: No Known Allergies   PERTINENT MEDICATIONS:  Outpatient Encounter Medications as of 12/16/2021  Medication Sig   alendronate (FOSAMAX) 70 MG tablet Take 70 mg by mouth once a week. Take with a full glass of water on an empty stomach.   ARIPiprazole (ABILIFY) 2 MG tablet Take 2 mg by mouth daily.   levothyroxine (SYNTHROID, LEVOTHROID) 125 MCG tablet Take 125 mcg by mouth daily.     memantine (NAMENDA) 10 MG tablet TAKE ONE TABLET BY MOUTH AT BREAKFAST AND AT BEDTIME   omeprazole (PRILOSEC) 20 MG capsule Take 20 mg by mouth daily.     sertraline (ZOLOFT) 100 MG tablet Take 100 mg by  mouth daily. Taking 200 mg   traZODone (DESYREL) 100 MG tablet Take 100 mg by mouth at bedtime.     No facility-administered encounter medications on file as of 12/16/2021.     -------------------------------------------------------------------------------- Advance Care Planning/Goals of Care: Goals include to maximize quality of life and symptom management.  Exploration of goals of care in the event of a sudden injury or illness-DNR/DNI Identification  of a healthcare agent-daughter Review of advance directive document-DNR and MOST. Decision not to resuscitate or to de-escalate disease focused treatments due to poor prognosis. CODE STATUS:  MOST as of 11/24/21: DNR/DNI/comfort measures Antibiotics if indicated IV fluids on case by case, time limited basis  Feeding tube not addressed   Thank you for the opportunity to participate in the care of Ms. [place].  The palliative care team will continue to follow. Please call our office at [phone] if we can be of additional assistance.   Marijo Conception, FNP-C  COVID-19 PATIENT SCREENING TOOL Asked and negative response unless otherwise noted:  Have you had symptoms of covid, tested positive or been in contact with someone with symptoms/positive test in the past 5-10 days?  unknown

## 2021-12-17 DIAGNOSIS — E038 Other specified hypothyroidism: Secondary | ICD-10-CM | POA: Diagnosis not present

## 2021-12-17 DIAGNOSIS — Z9889 Other specified postprocedural states: Secondary | ICD-10-CM | POA: Insufficient documentation

## 2021-12-25 DIAGNOSIS — M25552 Pain in left hip: Secondary | ICD-10-CM | POA: Diagnosis not present

## 2021-12-31 DIAGNOSIS — R600 Localized edema: Secondary | ICD-10-CM | POA: Diagnosis not present

## 2022-01-12 DIAGNOSIS — G9340 Encephalopathy, unspecified: Secondary | ICD-10-CM | POA: Diagnosis not present

## 2022-01-12 DIAGNOSIS — E440 Moderate protein-calorie malnutrition: Secondary | ICD-10-CM | POA: Diagnosis not present

## 2022-01-13 DIAGNOSIS — B351 Tinea unguium: Secondary | ICD-10-CM | POA: Diagnosis not present

## 2022-01-13 DIAGNOSIS — I7091 Generalized atherosclerosis: Secondary | ICD-10-CM | POA: Diagnosis not present

## 2022-01-27 DIAGNOSIS — R600 Localized edema: Secondary | ICD-10-CM | POA: Diagnosis not present

## 2022-01-27 DIAGNOSIS — S32512A Fracture of superior rim of left pubis, initial encounter for closed fracture: Secondary | ICD-10-CM | POA: Diagnosis not present

## 2022-01-27 DIAGNOSIS — S32592A Other specified fracture of left pubis, initial encounter for closed fracture: Secondary | ICD-10-CM | POA: Diagnosis not present

## 2022-01-27 DIAGNOSIS — G309 Alzheimer's disease, unspecified: Secondary | ICD-10-CM | POA: Diagnosis not present

## 2022-01-27 DIAGNOSIS — S72002D Fracture of unspecified part of neck of left femur, subsequent encounter for closed fracture with routine healing: Secondary | ICD-10-CM | POA: Diagnosis not present

## 2022-01-28 DIAGNOSIS — R0789 Other chest pain: Secondary | ICD-10-CM | POA: Diagnosis not present

## 2022-01-28 DIAGNOSIS — I951 Orthostatic hypotension: Secondary | ICD-10-CM | POA: Diagnosis not present

## 2022-01-28 DIAGNOSIS — R531 Weakness: Secondary | ICD-10-CM | POA: Diagnosis not present

## 2022-01-28 DIAGNOSIS — R6521 Severe sepsis with septic shock: Secondary | ICD-10-CM | POA: Diagnosis not present

## 2022-01-28 DIAGNOSIS — S299XXA Unspecified injury of thorax, initial encounter: Secondary | ICD-10-CM | POA: Diagnosis not present

## 2022-01-28 DIAGNOSIS — R6889 Other general symptoms and signs: Secondary | ICD-10-CM | POA: Diagnosis not present

## 2022-01-28 DIAGNOSIS — R0902 Hypoxemia: Secondary | ICD-10-CM | POA: Diagnosis not present

## 2022-01-28 DIAGNOSIS — M542 Cervicalgia: Secondary | ICD-10-CM | POA: Diagnosis not present

## 2022-01-28 DIAGNOSIS — S42002A Fracture of unspecified part of left clavicle, initial encounter for closed fracture: Secondary | ICD-10-CM | POA: Diagnosis not present

## 2022-01-28 DIAGNOSIS — K661 Hemoperitoneum: Secondary | ICD-10-CM | POA: Diagnosis not present

## 2022-01-28 DIAGNOSIS — E861 Hypovolemia: Secondary | ICD-10-CM | POA: Diagnosis not present

## 2022-01-28 DIAGNOSIS — I700 Atherosclerosis of aorta: Secondary | ICD-10-CM | POA: Diagnosis not present

## 2022-01-28 DIAGNOSIS — F02C0 Dementia in other diseases classified elsewhere, severe, without behavioral disturbance, psychotic disturbance, mood disturbance, and anxiety: Secondary | ICD-10-CM | POA: Diagnosis not present

## 2022-01-28 DIAGNOSIS — D649 Anemia, unspecified: Secondary | ICD-10-CM | POA: Diagnosis not present

## 2022-01-28 DIAGNOSIS — S42035A Nondisplaced fracture of lateral end of left clavicle, initial encounter for closed fracture: Secondary | ICD-10-CM | POA: Diagnosis not present

## 2022-01-28 DIAGNOSIS — Z743 Need for continuous supervision: Secondary | ICD-10-CM | POA: Diagnosis not present

## 2022-01-28 DIAGNOSIS — T68XXXA Hypothermia, initial encounter: Secondary | ICD-10-CM | POA: Diagnosis not present

## 2022-01-28 DIAGNOSIS — R001 Bradycardia, unspecified: Secondary | ICD-10-CM | POA: Diagnosis not present

## 2022-01-28 DIAGNOSIS — S0990XA Unspecified injury of head, initial encounter: Secondary | ICD-10-CM | POA: Diagnosis not present

## 2022-01-28 DIAGNOSIS — I493 Ventricular premature depolarization: Secondary | ICD-10-CM | POA: Diagnosis not present

## 2022-01-28 DIAGNOSIS — Z66 Do not resuscitate: Secondary | ICD-10-CM | POA: Diagnosis not present

## 2022-01-28 DIAGNOSIS — I4891 Unspecified atrial fibrillation: Secondary | ICD-10-CM | POA: Diagnosis not present

## 2022-01-28 DIAGNOSIS — Y998 Other external cause status: Secondary | ICD-10-CM | POA: Diagnosis not present

## 2022-01-28 DIAGNOSIS — D6489 Other specified anemias: Secondary | ICD-10-CM | POA: Diagnosis not present

## 2022-01-28 DIAGNOSIS — S32512A Fracture of superior rim of left pubis, initial encounter for closed fracture: Secondary | ICD-10-CM | POA: Diagnosis not present

## 2022-01-28 DIAGNOSIS — R680 Hypothermia, not associated with low environmental temperature: Secondary | ICD-10-CM | POA: Diagnosis not present

## 2022-01-28 DIAGNOSIS — S42032A Displaced fracture of lateral end of left clavicle, initial encounter for closed fracture: Secondary | ICD-10-CM | POA: Diagnosis not present

## 2022-01-28 DIAGNOSIS — N3289 Other specified disorders of bladder: Secondary | ICD-10-CM | POA: Diagnosis not present

## 2022-01-28 DIAGNOSIS — N3001 Acute cystitis with hematuria: Secondary | ICD-10-CM | POA: Diagnosis not present

## 2022-01-28 DIAGNOSIS — I442 Atrioventricular block, complete: Secondary | ICD-10-CM | POA: Diagnosis not present

## 2022-01-28 DIAGNOSIS — I959 Hypotension, unspecified: Secondary | ICD-10-CM | POA: Diagnosis not present

## 2022-01-28 DIAGNOSIS — R296 Repeated falls: Secondary | ICD-10-CM | POA: Diagnosis not present

## 2022-01-28 DIAGNOSIS — R519 Headache, unspecified: Secondary | ICD-10-CM | POA: Diagnosis not present

## 2022-01-28 DIAGNOSIS — M546 Pain in thoracic spine: Secondary | ICD-10-CM | POA: Diagnosis not present

## 2022-01-28 DIAGNOSIS — R579 Shock, unspecified: Secondary | ICD-10-CM | POA: Diagnosis not present

## 2022-01-28 DIAGNOSIS — S32592A Other specified fracture of left pubis, initial encounter for closed fracture: Secondary | ICD-10-CM | POA: Diagnosis not present

## 2022-01-28 DIAGNOSIS — R410 Disorientation, unspecified: Secondary | ICD-10-CM | POA: Diagnosis not present

## 2022-01-28 DIAGNOSIS — Z993 Dependence on wheelchair: Secondary | ICD-10-CM | POA: Diagnosis not present

## 2022-01-28 DIAGNOSIS — E039 Hypothyroidism, unspecified: Secondary | ICD-10-CM | POA: Diagnosis not present

## 2022-01-28 DIAGNOSIS — Z043 Encounter for examination and observation following other accident: Secondary | ICD-10-CM | POA: Diagnosis not present

## 2022-01-28 DIAGNOSIS — S3282XA Multiple fractures of pelvis without disruption of pelvic ring, initial encounter for closed fracture: Secondary | ICD-10-CM | POA: Diagnosis not present

## 2022-01-28 DIAGNOSIS — A419 Sepsis, unspecified organism: Secondary | ICD-10-CM | POA: Diagnosis not present

## 2022-01-28 DIAGNOSIS — R52 Pain, unspecified: Secondary | ICD-10-CM | POA: Diagnosis not present

## 2022-01-28 DIAGNOSIS — S300XXA Contusion of lower back and pelvis, initial encounter: Secondary | ICD-10-CM | POA: Diagnosis not present

## 2022-01-28 DIAGNOSIS — K5730 Diverticulosis of large intestine without perforation or abscess without bleeding: Secondary | ICD-10-CM | POA: Diagnosis not present

## 2022-01-28 DIAGNOSIS — K5641 Fecal impaction: Secondary | ICD-10-CM | POA: Diagnosis not present

## 2022-01-28 DIAGNOSIS — Z9181 History of falling: Secondary | ICD-10-CM | POA: Diagnosis not present

## 2022-01-28 DIAGNOSIS — Z79899 Other long term (current) drug therapy: Secondary | ICD-10-CM | POA: Diagnosis not present

## 2022-01-28 DIAGNOSIS — Z781 Physical restraint status: Secondary | ICD-10-CM | POA: Diagnosis not present

## 2022-01-28 DIAGNOSIS — G301 Alzheimer's disease with late onset: Secondary | ICD-10-CM | POA: Diagnosis not present

## 2022-01-28 DIAGNOSIS — N1330 Unspecified hydronephrosis: Secondary | ICD-10-CM | POA: Diagnosis not present

## 2022-01-28 DIAGNOSIS — W19XXXA Unspecified fall, initial encounter: Secondary | ICD-10-CM | POA: Diagnosis not present

## 2022-01-28 DIAGNOSIS — N390 Urinary tract infection, site not specified: Secondary | ICD-10-CM | POA: Diagnosis not present

## 2022-01-28 DIAGNOSIS — S329XXA Fracture of unspecified parts of lumbosacral spine and pelvis, initial encounter for closed fracture: Secondary | ICD-10-CM | POA: Diagnosis not present

## 2022-01-28 DIAGNOSIS — Z1612 Extended spectrum beta lactamase (ESBL) resistance: Secondary | ICD-10-CM | POA: Diagnosis not present

## 2022-01-28 DIAGNOSIS — S3289XA Fracture of other parts of pelvis, initial encounter for closed fracture: Secondary | ICD-10-CM | POA: Diagnosis not present

## 2022-01-28 DIAGNOSIS — Z7982 Long term (current) use of aspirin: Secondary | ICD-10-CM | POA: Diagnosis not present

## 2022-01-28 DIAGNOSIS — I499 Cardiac arrhythmia, unspecified: Secondary | ICD-10-CM | POA: Diagnosis not present

## 2022-01-28 DIAGNOSIS — E063 Autoimmune thyroiditis: Secondary | ICD-10-CM | POA: Diagnosis not present

## 2022-01-28 DIAGNOSIS — G309 Alzheimer's disease, unspecified: Secondary | ICD-10-CM | POA: Diagnosis not present

## 2022-01-28 DIAGNOSIS — W1789XA Other fall from one level to another, initial encounter: Secondary | ICD-10-CM | POA: Diagnosis not present

## 2022-01-28 DIAGNOSIS — E872 Acidosis, unspecified: Secondary | ICD-10-CM | POA: Diagnosis not present

## 2022-01-28 DIAGNOSIS — S32810A Multiple fractures of pelvis with stable disruption of pelvic ring, initial encounter for closed fracture: Secondary | ICD-10-CM | POA: Diagnosis not present

## 2022-01-28 DIAGNOSIS — M4312 Spondylolisthesis, cervical region: Secondary | ICD-10-CM | POA: Diagnosis not present

## 2022-01-29 DIAGNOSIS — S32512A Fracture of superior rim of left pubis, initial encounter for closed fracture: Secondary | ICD-10-CM | POA: Diagnosis not present

## 2022-01-29 DIAGNOSIS — S32592A Other specified fracture of left pubis, initial encounter for closed fracture: Secondary | ICD-10-CM | POA: Diagnosis not present

## 2022-02-06 DIAGNOSIS — W19XXXD Unspecified fall, subsequent encounter: Secondary | ICD-10-CM | POA: Diagnosis not present

## 2022-02-06 DIAGNOSIS — S72002A Fracture of unspecified part of neck of left femur, initial encounter for closed fracture: Secondary | ICD-10-CM | POA: Diagnosis not present

## 2022-02-06 DIAGNOSIS — S42002A Fracture of unspecified part of left clavicle, initial encounter for closed fracture: Secondary | ICD-10-CM | POA: Diagnosis not present

## 2022-02-06 DIAGNOSIS — R001 Bradycardia, unspecified: Secondary | ICD-10-CM | POA: Diagnosis not present

## 2022-02-20 DIAGNOSIS — S72002A Fracture of unspecified part of neck of left femur, initial encounter for closed fracture: Secondary | ICD-10-CM | POA: Diagnosis not present

## 2022-04-21 DIAGNOSIS — E039 Hypothyroidism, unspecified: Secondary | ICD-10-CM | POA: Diagnosis not present

## 2022-04-21 DIAGNOSIS — E559 Vitamin D deficiency, unspecified: Secondary | ICD-10-CM | POA: Diagnosis not present

## 2022-10-31 DEATH — deceased
